# Patient Record
Sex: Female | Born: 1970 | Race: White | Hispanic: No | Marital: Married | Smoking: Never smoker
Health system: Southern US, Community
[De-identification: ages and names within clinical notes are randomized; demographics above are authoritative.]

## PROBLEM LIST (undated history)

## (undated) DIAGNOSIS — R51 Headache: Secondary | ICD-10-CM

## (undated) DIAGNOSIS — D649 Anemia, unspecified: Secondary | ICD-10-CM

## (undated) DIAGNOSIS — R0602 Shortness of breath: Secondary | ICD-10-CM

## (undated) DIAGNOSIS — M1990 Unspecified osteoarthritis, unspecified site: Secondary | ICD-10-CM

## (undated) DIAGNOSIS — F32A Depression, unspecified: Secondary | ICD-10-CM

## (undated) DIAGNOSIS — Q2112 Patent foramen ovale: Secondary | ICD-10-CM

## (undated) DIAGNOSIS — D682 Hereditary deficiency of other clotting factors: Secondary | ICD-10-CM

## (undated) DIAGNOSIS — Q211 Atrial septal defect: Secondary | ICD-10-CM

## (undated) DIAGNOSIS — D6851 Activated protein C resistance: Secondary | ICD-10-CM

## (undated) DIAGNOSIS — F329 Major depressive disorder, single episode, unspecified: Secondary | ICD-10-CM

## (undated) DIAGNOSIS — I2699 Other pulmonary embolism without acute cor pulmonale: Secondary | ICD-10-CM

## (undated) HISTORY — PX: CHOLECYSTECTOMY: SHX55

## (undated) HISTORY — PX: ABDOMINAL HYSTERECTOMY: SHX81

## (undated) HISTORY — PX: TONSILLECTOMY: SUR1361

## (undated) HISTORY — PX: ANKLE SURGERY: SHX546

## (undated) HISTORY — PX: PATENT FORAMEN OVALE(PFO) CLOSURE: CATH118300

## (undated) HISTORY — PX: OTHER SURGICAL HISTORY: SHX169

---

## 1999-11-08 ENCOUNTER — Ambulatory Visit (HOSPITAL_BASED_OUTPATIENT_CLINIC_OR_DEPARTMENT_OTHER): Admission: RE | Admit: 1999-11-08 | Discharge: 1999-11-09 | Payer: Self-pay | Admitting: *Deleted

## 1999-11-08 ENCOUNTER — Encounter (INDEPENDENT_AMBULATORY_CARE_PROVIDER_SITE_OTHER): Payer: Self-pay | Admitting: *Deleted

## 2009-03-17 ENCOUNTER — Encounter: Admission: RE | Admit: 2009-03-17 | Discharge: 2009-03-17 | Payer: Self-pay | Admitting: Neurosurgery

## 2009-03-30 ENCOUNTER — Encounter: Admission: RE | Admit: 2009-03-30 | Discharge: 2009-03-30 | Payer: Self-pay | Admitting: Neurosurgery

## 2010-06-02 NOTE — Op Note (Signed)
[facility]  Patient:    Colleen Olsen, Colleen Olsen                   MRN: [id number] Proc. Date: 11/08/99 Adm. Date:  [id number] Attending:  Aundria Mems                           Operative Report  PREOPERATIVE DIAGNOSIS:  Chronic hyperplastic adenotonsillitis.  POSTOPERATIVE DIAGNOSIS:  Chronic hyperplastic adenotonsillitis.  PROCEDURE:  Adenotonsillectomy.  DESCRIPTION OF PROCEDURE:  The patient had general orotracheal anesthesia. The Crowe-Davis mouth gag was inserted and the patient put in the [place] position.  Inspection of the oral cavity revealed tonsils were 4+ enlarged and kissing.  Able to get a glimpse with a mirror, and there was a moderate amount of adenoid tissue near the posterior choanae in the midline.  It was elected to remove the tonils initially because of their size and the accessibility to the nasopharynx.  The left tonsil was grasped at the superior pole and removed by electrical dissection, maintaining hemostasis with electrocautery.  The right tonsil was removed in a similar fashion.  A red rubber catheter was then placed through the left nasal chamber and used to elevate the soft palate. Under mirror visualization of the nasopharynx, the moderate-sized midline adenoid tissue near the posterior choanae was ablated by suction cauterization thoroughly.  Hemostasis remained complete.  Blood loss for the procedure is less than 10 cc.  The patient tolerated the procedure well, was taken to the recovery room in stable general condition. DD:  11/08/99 TD:  11/08/99 Job: [id number]

## 2012-09-05 ENCOUNTER — Inpatient Hospital Stay (HOSPITAL_COMMUNITY)
Admission: AD | Admit: 2012-09-05 | Discharge: 2012-09-09 | DRG: 195 | Disposition: A | Discharge status: Home / Self Care | Payer: 59 | Source: Other Acute Inpatient Hospital | Attending: Family Medicine | Admitting: Family Medicine

## 2012-09-05 ENCOUNTER — Encounter (HOSPITAL_COMMUNITY): Payer: Self-pay | Admitting: *Deleted

## 2012-09-05 DIAGNOSIS — Z9884 Bariatric surgery status: Secondary | ICD-10-CM

## 2012-09-05 DIAGNOSIS — Z77090 Contact with and (suspected) exposure to asbestos: Secondary | ICD-10-CM

## 2012-09-05 DIAGNOSIS — K5900 Constipation, unspecified: Secondary | ICD-10-CM | POA: Diagnosis not present

## 2012-09-05 DIAGNOSIS — J189 Pneumonia, unspecified organism: Principal | ICD-10-CM | POA: Diagnosis present

## 2012-09-05 DIAGNOSIS — B373 Candidiasis of vulva and vagina: Secondary | ICD-10-CM | POA: Diagnosis not present

## 2012-09-05 DIAGNOSIS — F329 Major depressive disorder, single episode, unspecified: Secondary | ICD-10-CM | POA: Diagnosis present

## 2012-09-05 DIAGNOSIS — M545 Low back pain, unspecified: Secondary | ICD-10-CM | POA: Diagnosis present

## 2012-09-05 DIAGNOSIS — B3731 Acute candidiasis of vulva and vagina: Secondary | ICD-10-CM | POA: Diagnosis not present

## 2012-09-05 DIAGNOSIS — E669 Obesity, unspecified: Secondary | ICD-10-CM | POA: Diagnosis present

## 2012-09-05 DIAGNOSIS — Z79899 Other long term (current) drug therapy: Secondary | ICD-10-CM

## 2012-09-05 DIAGNOSIS — Z6834 Body mass index (BMI) 34.0-34.9, adult: Secondary | ICD-10-CM

## 2012-09-05 DIAGNOSIS — G8929 Other chronic pain: Secondary | ICD-10-CM | POA: Diagnosis present

## 2012-09-05 DIAGNOSIS — F3289 Other specified depressive episodes: Secondary | ICD-10-CM | POA: Diagnosis present

## 2012-09-05 HISTORY — DX: Unspecified osteoarthritis, unspecified site: M19.90

## 2012-09-05 HISTORY — DX: Depression, unspecified: F32.A

## 2012-09-05 HISTORY — DX: Major depressive disorder, single episode, unspecified: F32.9

## 2012-09-05 HISTORY — DX: Headache: R51

## 2012-09-05 HISTORY — DX: Anemia, unspecified: D64.9

## 2012-09-05 HISTORY — DX: Shortness of breath: R06.02

## 2012-09-05 MED ORDER — MORPHINE SULFATE ER 15 MG PO TBCR
15.0000 mg | EXTENDED_RELEASE_TABLET | Freq: Three times a day (TID) | ORAL | Status: DC
  Administered 2012-09-05 – 2012-09-09 (×11): 15 mg via ORAL
  Filled 2012-09-05 (×11): qty 1

## 2012-09-05 MED ORDER — ACETAMINOPHEN 325 MG PO TABS
650.0000 mg | ORAL_TABLET | Freq: Four times a day (QID) | ORAL | Status: DC | PRN
  Administered 2012-09-08: 650 mg via ORAL
  Filled 2012-09-05: qty 2

## 2012-09-05 MED ORDER — ACETAMINOPHEN 650 MG RE SUPP: 650.0000 mg | Freq: Four times a day (QID) | RECTAL | Status: DC | PRN

## 2012-09-05 MED ORDER — DULOXETINE HCL 60 MG PO CPEP
60.0000 mg | ORAL_CAPSULE | Freq: Every day | ORAL | Status: DC
Start: 2012-09-06 — End: 2012-09-06

## 2012-09-05 MED ORDER — TOPIRAMATE 100 MG PO TABS: 100.0000 mg | ORAL_TABLET | Freq: Every day | ORAL | Status: DC

## 2012-09-05 MED ORDER — ONDANSETRON HCL 4 MG/2ML IJ SOLN: 4.0000 mg | Freq: Four times a day (QID) | INTRAMUSCULAR | Status: DC | PRN

## 2012-09-05 MED ORDER — ALBUTEROL SULFATE (5 MG/ML) 0.5% IN NEBU
2.5000 mg | INHALATION_SOLUTION | Freq: Four times a day (QID) | RESPIRATORY_TRACT | Status: DC
  Administered 2012-09-06: 2.5 mg via RESPIRATORY_TRACT
  Filled 2012-09-05: qty 0.5

## 2012-09-05 MED ORDER — ALUM & MAG HYDROXIDE-SIMETH 200-200-20 MG/5ML PO SUSP: 30.0000 mL | Freq: Four times a day (QID) | ORAL | Status: DC | PRN

## 2012-09-05 MED ORDER — ONDANSETRON HCL 4 MG PO TABS
4.0000 mg | ORAL_TABLET | Freq: Four times a day (QID) | ORAL | Status: DC | PRN
  Administered 2012-09-07: 4 mg via ORAL
  Filled 2012-09-05: qty 1

## 2012-09-05 MED ORDER — VANCOMYCIN HCL 10 G IV SOLR
2000.0000 mg | Freq: Once | INTRAVENOUS | Status: AC
  Administered 2012-09-06: 2000 mg via INTRAVENOUS
  Filled 2012-09-05: qty 2000

## 2012-09-05 MED ORDER — GABAPENTIN 600 MG PO TABS
600.0000 mg | ORAL_TABLET | Freq: Four times a day (QID) | ORAL | Status: DC
  Administered 2012-09-05 – 2012-09-09 (×14): 600 mg via ORAL
  Filled 2012-09-05 (×17): qty 1

## 2012-09-05 MED ORDER — ENOXAPARIN SODIUM 40 MG/0.4ML ~~LOC~~ SOLN
40.0000 mg | SUBCUTANEOUS | Status: DC
  Administered 2012-09-05 – 2012-09-08 (×4): 40 mg via SUBCUTANEOUS
  Filled 2012-09-05 (×5): qty 0.4

## 2012-09-05 MED ORDER — ALBUTEROL SULFATE (5 MG/ML) 0.5% IN NEBU: 2.5000 mg | INHALATION_SOLUTION | RESPIRATORY_TRACT | Status: DC | PRN

## 2012-09-05 MED ORDER — VANCOMYCIN HCL IN DEXTROSE 1-5 GM/200ML-% IV SOLN
1000.0000 mg | Freq: Two times a day (BID) | INTRAVENOUS | Status: DC
  Administered 2012-09-06 – 2012-09-08 (×4): 1000 mg via INTRAVENOUS
  Filled 2012-09-05 (×5): qty 200

## 2012-09-05 MED ORDER — HYDROMORPHONE HCL PF 1 MG/ML IJ SOLN: 0.5000 mg | INTRAMUSCULAR | Status: DC | PRN

## 2012-09-05 MED ORDER — MELOXICAM 15 MG PO TABS: 15.0000 mg | ORAL_TABLET | Freq: Every day | ORAL | Status: DC

## 2012-09-05 MED ORDER — SODIUM CHLORIDE 0.9 % IV SOLN
INTRAVENOUS | Status: DC
  Administered 2012-09-05 – 2012-09-07 (×2): via INTRAVENOUS

## 2012-09-05 MED ORDER — ZOLPIDEM TARTRATE 5 MG PO TABS
5.0000 mg | ORAL_TABLET | Freq: Every evening | ORAL | Status: DC | PRN
  Administered 2012-09-06 – 2012-09-08 (×3): 5 mg via ORAL
  Filled 2012-09-05 (×3): qty 1

## 2012-09-05 MED ORDER — OXYCODONE HCL 5 MG PO TABS: 5.0000 mg | ORAL_TABLET | ORAL | Status: DC | PRN

## 2012-09-05 MED ORDER — DEXTROSE 5 % IV SOLN
2.0000 g | Freq: Two times a day (BID) | INTRAVENOUS | Status: DC
  Administered 2012-09-06 – 2012-09-08 (×6): 2 g via INTRAVENOUS
  Filled 2012-09-05 (×7): qty 2

## 2012-09-05 MED ORDER — LEVOFLOXACIN IN D5W 750 MG/150ML IV SOLN
750.0000 mg | INTRAVENOUS | Status: DC
  Administered 2012-09-06 – 2012-09-07 (×3): 750 mg via INTRAVENOUS
  Filled 2012-09-05 (×4): qty 150

## 2012-09-05 NOTE — H&P (Signed)
[facility] History and Physical  STARSHA MORNING WNU:[id number] DOB: 11-06-1970 DOA: 09/05/2012  Referring physician:  Bobette Mo EDP PCP:  Mauricio Po  Specialists:   Chief Complaint: Fevers Chills Cough and SOB.  HPI: Colleen Olsen is a 42 y.o. female transferred from the [facility] ED to [facility] at family's request due to worsening pneumonia.  She was treated for pneumonia since 08/03 with outpatient antibiotics first Azithromycin, then 3 days of Levaquin, and then she had 1 day of clindamycin.   She returned to the ED at [facility] due to worsening SOB, fevers and chills and dry cough.   A ct scan was performed and revealed worsening LLL infiltrates.       Review of Systems: The patient denies anorexia, headaches, weight loss, vision loss, diplopia, dizziness, decreased hearing, rhinitis, hoarseness, chest pain, syncope, dyspnea on exertion, peripheral edema, balance deficits, hemoptysis, abdominal pain, nausea, vomiting, diarrhea, constipation, hematemesis, melena, hematochezia, severe indigestion/heartburn, dysuria, hematuria, incontinence, muscle weakness, suspicious skin lesions, transient blindness, difficulty walking, depression, unusual weight change, abnormal bleeding, enlarged lymph nodes, angioedema, and breast masses.    Past Medical History  Diagnosis Date  . Depression   . Shortness of breath   . Headache(784.0)   . Arthritis   . Anemia     Past Surgical History  Procedure Laterality Date  . Cholecystectomy    . Tonsillectomy    . Gastri bypass 2003      Prior to Admission medications   Medication Sig Start Date End Date Taking? Authorizing Provider  cyanocobalamin (,VITAMIN B-12,) 1000 MCG/ML injection Inject 1,000 mcg into the skin every 30 (thirty) days.   Yes Historical Provider, MD  DULoxetine (CYMBALTA) 60 MG capsule Take 60 mg by mouth daily.   Yes Historical Provider, MD  gabapentin (NEURONTIN) 600 MG tablet Take 600 mg  by mouth 4 (four) times daily.   Yes Historical Provider, MD  meloxicam (MOBIC) 15 MG tablet Take 15 mg by mouth daily.   Yes Historical Provider, MD  morphine (MSIR) 15 MG tablet Take 15 mg by mouth 3 (three) times daily.   Yes Historical Provider, MD  topiramate (TOPAMAX) 100 MG tablet Take 100 mg by mouth daily.   Yes Historical Provider, MD  zolpidem (AMBIEN) 10 MG tablet Take 10 mg by mouth at bedtime.   Yes Historical Provider, MD    Allergies  Allergen Reactions  . Doxycycline     confusion  . Latex     Hives, burning  . Paxil [Paroxetine Hcl]     Hives, itching  . Prozac [Fluoxetine Hcl]     Hives, itching  . Sulfa Antibiotics     Hives     Social History:  has no tobacco, alcohol, and drug history on file.     Family History  Problem Relation Age of Onset  . Bradycardia Father     Pacemaker  . Hypertension Father   . Hypertension Mother   . Lung cancer Mother     Deceased    (be sure to complete)   Physical Exam:  GEN:  Pleasant but Ill appearing Obese   42 y.o. Caucasian female  examined  and in no acute distress; cooperative with exam Filed Vitals:   09/05/12 2105  BP: 113/74  Pulse: 83  Temp: 98.5 F (36.9 C)  TempSrc: Oral  Resp: 18  Height: 5\' 8"  (1.727 m)  Weight: 102.6 kg (226 lb 3.1 oz)  SpO2: 100%   Blood pressure 113/74,  pulse 83, temperature 98.5 F (36.9 C), temperature source Oral, resp. rate 18, height 5\' 8"  (1.727 m), weight 102.6 kg (226 lb 3.1 oz), SpO2 100.00%. PSYCH: SHe is alert and oriented x4; does not appear anxious does not appear depressed; affect is normal HEENT: Normocephalic and Atraumatic, Mucous membranes pink; PERRLA; EOM intact; Fundi:  Benign;  No scleral icterus, Nares: Patent, Oropharynx: Clear, Fair Dentition, Neck:  FROM, no cervical lymphadenopathy nor thyromegaly or carotid bruit; no JVD; Breasts:: Not examined CHEST WALL: No tenderness CHEST: Normal respiration, clear to auscultation bilaterally HEART: Regular  rate and rhythm; no murmurs rubs or gallops BACK: No kyphosis or scoliosis; no CVA tenderness ABDOMEN: Positive Bowel Sounds, Obese, soft non-tender; no masses, no organomegaly. Rectal Exam: Not done EXTREMITIES: No cyanosis, clubbing or edema; no ulcerations. Genitalia: not examined PULSES: 2+ and symmetric SKIN: Normal hydration no rash or ulceration CNS: Cranial nerves 2-12 grossly intact no focal neurologic deficit    Labs on Admission:  Basic Metabolic Panel: No results found for this basename: NA, K, CL, CO2, GLUCOSE, BUN, CREATININE, CALCIUM, MG, PHOS,  in the last 168 hours Liver Function Tests: No results found for this basename: AST, ALT, ALKPHOS, BILITOT, PROT, ALBUMIN,  in the last 168 hours No results found for this basename: LIPASE, AMYLASE,  in the last 168 hours No results found for this basename: AMMONIA,  in the last 168 hours CBC: No results found for this basename: WBC, NEUTROABS, HGB, HCT, MCV, PLT,  in the last 168 hours Cardiac Enzymes: No results found for this basename: CKTOTAL, CKMB, CKMBINDEX, TROPONINI,  in the last 168 hours  BNP (last 3 results) No results found for this basename: PROBNP,  in the last 8760 hours CBG: No results found for this basename: GLUCAP,  in the last 168 hours  Radiological Exams on Admission: No results found.     Assessment/Plan Principal Problem:   HCAP (healthcare-associated pneumonia)   1.  HCAP- Blood cultures X 2 re-ordered, and Placed on IV Vancomycin, Cefepime and Levaquin, and Albuterol Nebs,  And O2 PRN.  Droplet Precautions.  ID Consult in AM.    2.  DVT prophylaxis with Lovenox.       Code Status:  FULL CODE  Family Communication:    Husband at Bedside Disposition Plan:     Return to Home on Discharge  Time spent: 33 Minutes  Ron Parker [facility] Pager [phone]  If 7PM-7AM, please contact night-coverage [email or web address] Password [facility] 09/05/2012, 10:15 PM

## 2012-09-05 NOTE — Progress Notes (Signed)
ANTIBIOTIC CONSULT NOTE - INITIAL  Pharmacy Consult for vancomycin Indication: pneumonia  Allergies  Allergen Reactions  . Doxycycline     confusion  . Latex     Hives, burning  . Paxil [Paroxetine Hcl]     Hives, itching  . Prozac [Fluoxetine Hcl]     Hives, itching  . Sulfa Antibiotics     Hives     Patient Measurements: Height: 5\' 8"  (172.7 cm) Weight: 226 lb 3.1 oz (102.6 kg) IBW/kg (Calculated) : 63.9   Vital Signs: Temp: 98.5 F (36.9 C) (08/22 2105) Temp src: Oral (08/22 2105) BP: 113/74 mmHg (08/22 2105) Pulse Rate: 83 (08/22 2105) Intake/Output from previous day:   Intake/Output from this shift:    Labs: No results found for this basename: WBC, HGB, PLT, LABCREA, CREATININE,  in the last 72 hours CrCl is unknown because no creatinine reading has been taken. No results found for this basename: VANCOTROUGH, VANCOPEAK, VANCORANDOM, GENTTROUGH, GENTPEAK, GENTRANDOM, TOBRATROUGH, TOBRAPEAK, TOBRARND, AMIKACINPEAK, AMIKACINTROU, AMIKACIN,  in the last 72 hours   Microbiology: No results found for this or any previous visit (from the past 720 hour(s)).  Medical History: Past Medical History  Diagnosis Date  . Depression   . Shortness of breath   . Headache(784.0)   . Arthritis   . Anemia     Medications:  See med hx  Assessment: Colleen Olsen is a 42 y.o. female transferred from the [facility] ED to [facility] at family's request due to worsening pneumonia. She was treated for pneumonia since 08/03 with outpatient antibiotics first Azithromycin, then 3 days of Levaquin, and then she had 1 day of clindamycin. She returned to the ED at [facility] due to worsening SOB, fevers and chills and dry cough. A ct scan was performed and revealed worsening LLL infiltrates.  Per pt she did not get any abx in the [facility] ED.  Bmet ordered for am labs, will assume normal renal function.  Wt = 102.6 kg.  Pharmacy has been asked to dose vancomycin after  blood cultures are drawn for HCAP as part of vanc/cefepime/levaquin therapy.  Blood cx have not yet been drawn.  An ID consult will be ordered in the AM.  Goal of Therapy:  Vancomycin trough level 15-20 mcg/ml  Plan:  1. Vancomycin 2000 mg loading dose after blood cultures have been drawn, then vancomycin 1000 mg IV q12h per obesity dosing nomogram 2. Cefepime 2 gm IV q12h per MD 3. Levaquin 750 mg IV q24 hr per MD 4. F/u am bmet to assess renal function 5. F/u fever curve, renal fxn, cultures, WBC, CXR, clinical course 6. F/u ID consult recs 7. Measure antibiotic drug levels at steady state Follow up culture results Herby Abraham, Pharm.D. [phone] 09/05/2012 11:05 PM

## 2012-09-06 ENCOUNTER — Encounter (HOSPITAL_COMMUNITY): Payer: Self-pay | Admitting: *Deleted

## 2012-09-06 LAB — STREP PNEUMONIAE URINARY ANTIGEN: Strep Pneumo Urinary Antigen: NEGATIVE

## 2012-09-06 LAB — BASIC METABOLIC PANEL
GFR calc Af Amer: >90 mL/min (ref >90)
GFR calc non Af Amer: 88 mL/min — ABNORMAL LOW (ref >90)
Potassium: 3.9 mEq/L (ref 3.5–5.1)
Sodium: 141 mEq/L (ref 135–145)

## 2012-09-06 LAB — CBC
MCH: 29.7 pg (ref 26.0–34.0)
MCHC: 32.4 g/dL (ref 30.0–36.0)
Platelets: 275 10*3/uL (ref 150–400)

## 2012-09-06 MED ORDER — NYSTATIN 100000 UNIT/ML MT SUSP
5.0000 mL | Freq: Four times a day (QID) | OROMUCOSAL | Status: DC
  Administered 2012-09-06 – 2012-09-09 (×10): 500000 [IU] via ORAL
  Filled 2012-09-06 (×14): qty 5

## 2012-09-06 MED ORDER — DULOXETINE HCL 60 MG PO CPEP
60.0000 mg | ORAL_CAPSULE | Freq: Every day | ORAL | Status: DC
  Administered 2012-09-06 – 2012-09-08 (×4): 60 mg via ORAL
  Filled 2012-09-06 (×5): qty 1

## 2012-09-06 MED ORDER — TOPIRAMATE 100 MG PO TABS
100.0000 mg | ORAL_TABLET | Freq: Every day | ORAL | Status: DC
  Administered 2012-09-06 – 2012-09-08 (×4): 100 mg via ORAL
  Filled 2012-09-06 (×5): qty 1

## 2012-09-06 MED ORDER — MELOXICAM 15 MG PO TABS
15.0000 mg | ORAL_TABLET | Freq: Every day | ORAL | Status: DC
  Administered 2012-09-06 – 2012-09-08 (×4): 15 mg via ORAL
  Filled 2012-09-06 (×5): qty 1

## 2012-09-06 MED ORDER — ALBUTEROL SULFATE (5 MG/ML) 0.5% IN NEBU: 2.5000 mg | INHALATION_SOLUTION | RESPIRATORY_TRACT | Status: DC | PRN

## 2012-09-06 NOTE — Progress Notes (Signed)
RT completed patient assessment.  Patient transferred from another facility for PNA.  No chest xray on file but H&P note stated that CT at [place] showed worsening PNA.  Patient has no respiratory history and has never smoked.  Pt has never taken treatments before.  RT is going to start patient on a flutter device. BBS are diminished.  Sats 94% on RA.    Changed treatments to Q4prn.

## 2012-09-06 NOTE — Progress Notes (Signed)
Colleen Olsen GNF:[id number] DOB: 1970-08-28 DOA: 09/05/2012 PCP: No primary provider on file.  Brief narrative: 42 yr old CF admitted from [facility] Ed 8/22 presenting with worsening symptoms of shortness of breath. She states she initially started feeling poorly 08/17/2012 and presented to the emergency room where she was given a course of azithromycin which she obtained on 08/20/2012 and completed on 08/28/2012. She still had some mild malaise and abdominal pain and represented to [facility] ED and was sent home. On 09/04/2012, she went back back to the emergency room with temperature of 104, and a CT scan was performed at the time showing pneumonia which was persistent.  She was prescribed Levaquin for this and in the interim 18 821 09/05/2012 clindamycin was added on because of results of a blood culture Her primary care physician Dr. Mauricio Po performed another CT scan which confirmed on 09/05/2012 D pneumonia with findings and changes suggestive of either MAC, Legionella, atypical allergic phenomenon  Past medical history-As per Problem list Chart reviewed as below- None  Consultants:  None as yet  Procedures:  None performed here, CT scan as above  Antibiotics:  Cefepime 8/22  Levofloxacin 8/22  Vancomycin 8/22   Subjective  Feel that she is doing a little better. Still has some right upper quadrant pain. Denies nausea vomiting chest pain Had some heaviness in her chest and mild shortness of rest overnight but this is resolving. Tolerating diet. Husband at bedside   Objective    Interim History: None  Telemetry: Non tele  Objective: Filed Vitals:   09/05/12 2105 09/06/12 0246 09/06/12 0615  BP: 113/74  105/71  Pulse: 83  87  Temp: 98.5 F (36.9 C)  98.5 F (36.9 C)  TempSrc: Oral  Oral  Resp: 18  20  Height: 5\' 8"  (1.727 m)    Weight: 102.6 kg (226 lb 3.1 oz)    SpO2: 100% 94% 95%    Intake/Output Summary (Last 24 hours) at 09/06/12  0830 Last data filed at 09/06/12 0600  Gross per 24 hour  Intake   1840 ml  Output      0 ml  Net   1840 ml    Exam:  General: EOMI, flat affect.  Cardiovascular: s1 s2 no m/r/g Respiratory: clear, no TVR, TVF Abdomen: soft, NT/ND Skin no LE edema Neuro-ROM intact  Data Reviewed: Basic Metabolic Panel:  Recent Labs Lab 09/06/12 0620  NA 141  K 3.9  CL 110  CO2 24  GLUCOSE 90  BUN 10  CREATININE 0.82  CALCIUM 8.5   Liver Function Tests: No results found for this basename: AST, ALT, ALKPHOS, BILITOT, PROT, ALBUMIN,  in the last 168 hours No results found for this basename: LIPASE, AMYLASE,  in the last 168 hours No results found for this basename: AMMONIA,  in the last 168 hours CBC:  Recent Labs Lab 09/06/12 0620  WBC 6.7  HGB 9.4*  HCT 29.0*  MCV 91.5  PLT 275   Cardiac Enzymes: No results found for this basename: CKTOTAL, CKMB, CKMBINDEX, TROPONINI,  in the last 168 hours BNP: No components found with this basename: POCBNP,  CBG: No results found for this basename: GLUCAP,  in the last 168 hours  No results found for this or any previous visit (from the past 240 hour(s)).   Studies:              All Imaging reviewed and is as per above notation   Scheduled Meds: . ceFEPime (  MAXIPIME) IV  2 g Intravenous Q12H  . DULoxetine  60 mg Oral QHS  . enoxaparin (LOVENOX) injection  40 mg Subcutaneous Q24H  . gabapentin  600 mg Oral QID  . levofloxacin (LEVAQUIN) IV  750 mg Intravenous Q24H  . meloxicam  15 mg Oral QHS  . morphine  15 mg Oral Q8H  . topiramate  100 mg Oral QHS  . vancomycin  1,000 mg Intravenous Q12H   Continuous Infusions: . sodium chloride 100 mL/hr at 09/05/12 2300     Assessment/Plan:  1. Failed outpatient Rx for Pnuemonia-Started broad spectrum HCAP coverage c Vanc, Cefepime, Levaquin. Continue Axid 100 cc per hour She has no White count or fever currently-Blood cultures from [facility] showed- 2 set showed 8/20-1+ for  Coagulase Neg staph, the other is neg so far.  Rpt CBC + Diff am-will get legionella, Strep Urinary antigen and HIV as not performed on admission-If patient doesn;t clinically improve, would get Pulm involved/ID, to review further options. 2. Reactive airway process-continue albuterol nebulization every 4 when necessary, flutter valve 3. H/o Exposure to asbestos-Works at [employer] in [place] and has been working at that building for 3 mo unlikely Pneumoconiosis 4. Depression-continue Cymbalta 60 mg daily 5. Chronic low back pain continue gabapentin 600 4 times a day, Topamax 100 daily  Code Status: Full Family Communication: Long and extensive discussion with family at bedside understand rationale for care and understand that we will consult if necessary further specialists Disposition Plan: Inpatient   Pleas Koch, MD  [facility] Pager [phone] 09/06/2012, 8:30 AM    LOS: 1 day

## 2012-09-07 LAB — CBC WITH DIFFERENTIAL/PLATELET
Basophils Relative: 1 % (ref 0–1)
Eosinophils Absolute: 0.9 10*3/uL — ABNORMAL HIGH (ref 0.0–0.7)
HCT: 29.8 % — ABNORMAL LOW (ref 36.0–46.0)
Hemoglobin: 9.3 g/dL — ABNORMAL LOW (ref 12.0–15.0)
MCH: 28.7 pg (ref 26.0–34.0)
MCHC: 31.2 g/dL (ref 30.0–36.0)
Monocytes Absolute: 0.6 10*3/uL (ref 0.1–1.0)
Monocytes Relative: 9 % (ref 3–12)
Neutro Abs: 3 10*3/uL (ref 1.7–7.7)

## 2012-09-07 LAB — LEGIONELLA ANTIGEN, URINE: Legionella Antigen, Urine: NEGATIVE

## 2012-09-07 MED ORDER — FLUCONAZOLE 150 MG PO TABS
150.0000 mg | ORAL_TABLET | Freq: Once | ORAL | Status: AC
  Administered 2012-09-07: 150 mg via ORAL
  Filled 2012-09-07: qty 1

## 2012-09-07 MED ORDER — SODIUM CHLORIDE 0.9 % IJ SOLN
3.0000 mL | Freq: Two times a day (BID) | INTRAMUSCULAR | Status: DC
  Administered 2012-09-07 – 2012-09-09 (×4): 3 mL via INTRAVENOUS

## 2012-09-07 MED ORDER — SENNOSIDES-DOCUSATE SODIUM 8.6-50 MG PO TABS
1.0000 | ORAL_TABLET | Freq: Two times a day (BID) | ORAL | Status: DC
  Administered 2012-09-07 – 2012-09-09 (×5): 1 via ORAL
  Filled 2012-09-07 (×5): qty 1

## 2012-09-07 NOTE — Progress Notes (Signed)
Colleen Olsen:[id number] DOB: February 17, 1970 DOA: 09/05/2012 PCP: No primary provider on file.  Brief narrative: 42 yr old CF admitted from [facility] Ed 8/22 presenting with worsening symptoms of shortness of breath. She states she initially started feeling poorly 08/17/2012 and presented to the emergency room where she was given a course of azithromycin which she obtained on 08/20/2012 and completed on 08/28/2012. She still had some mild malaise and abdominal pain and represented to [facility] ED and was sent home. On 09/04/2012, she went back back to the emergency room with temperature of 104, and a CT scan was performed at the time showing pneumonia which was persistent.  She was prescribed Levaquin for this and in the interim 18 821 09/05/2012 clindamycin was added on because of results of a blood culture Her primary care physician Dr. Mauricio Po performed another CT scan which confirmed on 09/05/2012  pneumonia with findings and changes suggestive of either MAC, Legionella, atypical allergic phenomenon  Past medical history-As per Problem list Chart reviewed as below- None  Consultants:  None as yet  Procedures:  None performed here, CT scan as above  Antibiotics:  Cefepime 8/22  Levofloxacin 8/22  Vancomycin 8/22   Subjective  Feel that she is doing much better. Felt like she had mouth pain and usually gets thrush Also has vaginal discharge No shortness breath no nausea no vomiting no blurred or double vision   Objective    Interim History: None  Telemetry: Non tele  Objective: Filed Vitals:   09/06/12 1909 09/06/12 2309 09/07/12 0332 09/07/12 0624  BP: 101/69 102/68 103/70 90/57  Pulse: 82 85 79 78  Temp: 98.2 F (36.8 C) 98.3 F (36.8 C) 98 F (36.7 C) 98.2 F (36.8 C)  TempSrc: Oral Oral Oral Oral  Resp: 20 18 18 20   Height:      Weight:      SpO2: 94% 95% 96% 96%    Intake/Output Summary (Last 24 hours) at 09/07/12 1433 Last data filed  at 09/07/12 0232  Gross per 24 hour  Intake 2643.33 ml  Output    800 ml  Net 1843.33 ml    Exam:  General: EOMI, flat affect.  Cardiovascular: s1 s2 no m/r/g Respiratory: clear, no TVR, TVF Abdomen: soft, NT/ND Skin no LE edema Neuro-ROM intact  Data Reviewed: Basic Metabolic Panel:  Recent Labs Lab 09/06/12 0620  NA 141  K 3.9  CL 110  CO2 24  GLUCOSE 90  BUN 10  CREATININE 0.82  CALCIUM 8.5   Liver Function Tests: No results found for this basename: AST, ALT, ALKPHOS, BILITOT, PROT, ALBUMIN,  in the last 168 hours No results found for this basename: LIPASE, AMYLASE,  in the last 168 hours No results found for this basename: AMMONIA,  in the last 168 hours CBC:  Recent Labs Lab 09/06/12 0620 09/07/12 0521  WBC 6.7 6.6  NEUTROABS  --  3.0  HGB 9.4* 9.3*  HCT 29.0* 29.8*  MCV 91.5 92.0  PLT 275 277   Cardiac Enzymes: No results found for this basename: CKTOTAL, CKMB, CKMBINDEX, TROPONINI,  in the last 168 hours BNP: No components found with this basename: POCBNP,  CBG: No results found for this basename: GLUCAP,  in the last 168 hours  Recent Results (from the past 240 hour(s))  CULTURE, BLOOD (ROUTINE X 2)     Status: None   Collection Time    09/05/12 11:00 PM      Result Value Range Status  Specimen Description BLOOD RIGHT ARM   Final   Special Requests BOTTLES DRAWN AEROBIC AND ANAEROBIC 5CC   Final   Culture  Setup Time     Final   Value: 09/06/2012 05:34     Performed at [facility]   Culture     Final   Value:        BLOOD CULTURE RECEIVED NO GROWTH TO DATE CULTURE WILL BE HELD FOR 5 DAYS BEFORE ISSUING A FINAL NEGATIVE REPORT     Performed at [facility]   Report Status PENDING   Incomplete  CULTURE, BLOOD (ROUTINE X 2)     Status: None   Collection Time    09/05/12 11:25 PM      Result Value Range Status   Specimen Description BLOOD LEFT ARM   Final   Special Requests BOTTLES DRAWN AEROBIC AND ANAEROBIC 5CC   Final     Culture  Setup Time     Final   Value: 09/06/2012 05:33     Performed at [facility]   Culture     Final   Value:        BLOOD CULTURE RECEIVED NO GROWTH TO DATE CULTURE WILL BE HELD FOR 5 DAYS BEFORE ISSUING A FINAL NEGATIVE REPORT     Performed at [facility]   Report Status PENDING   Incomplete     Studies:              All Imaging reviewed and is as per above notation   Scheduled Meds: . ceFEPime (MAXIPIME) IV  2 g Intravenous Q12H  . DULoxetine  60 mg Oral QHS  . enoxaparin (LOVENOX) injection  40 mg Subcutaneous Q24H  . gabapentin  600 mg Oral QID  . levofloxacin (LEVAQUIN) IV  750 mg Intravenous Q24H  . meloxicam  15 mg Oral QHS  . morphine  15 mg Oral Q8H  . nystatin  5 mL Oral QID  . senna-docusate  1 tablet Oral BID  . topiramate  100 mg Oral QHS  . vancomycin  1,000 mg Intravenous Q12H   Continuous Infusions:     Assessment/Plan:  1. Failed outpatient Rx for Pnuemonia-Started broad spectrum HCAP coverage c Vanc, Cefepime, Levaquin. IV fluids 100 cc per [facility] 8/24 She has no White count or fever currently-Blood cultures from [facility] showed- 2 set showed 8/20-1+ for Coagulase Neg staph, the other is neg so far.  White count stable and not elevated, Legionella negative HIV negative strep pneumo negative-consider narrowing at 6 to Levaquin monotherapy 8:25 AM 2. Reactive airway process-continue albuterol nebulization every 4 when necessary, flutter valve 3. H/o Exposure to asbestos-Works at [employer] in [place] and has been working at that building for 3 mo unlikely Pneumoconiosis 4. Candidiasis-to nystatin oral, fluconazole 150 4 vaginal candidiasis 5. Constipation-bowel regimen Senokot-S twice a day, Maalox 306 when necessary 6. Depression-continue Cymbalta 60 mg daily 7. Chronic low back pain continue gabapentin 600 4 times a day, Topamax 100 daily  Code Status: Full Family Communication: updated patient Disposition Plan:  Inpatient   Pleas Koch, MD  [facility] Pager [phone] 09/07/2012, 2:33 PM    LOS: 2 days

## 2012-09-08 MED ORDER — LEVOFLOXACIN 750 MG PO TABS
750.0000 mg | ORAL_TABLET | Freq: Every day | ORAL | Status: DC
  Administered 2012-09-08: 750 mg via ORAL
  Filled 2012-09-08 (×2): qty 1

## 2012-09-08 NOTE — Progress Notes (Signed)
Colleen Olsen:[id number] DOB: [birth year] DOA: 09/05/2012 PCP: No primary provider on file.  Brief narrative: 42 yr old CF admitted from [facility] Ed 8/22 presenting with worsening symptoms of shortness of breath. She states she initially started feeling poorly 08/17/2012 and presented to the emergency room where she was given a course of azithromycin which she obtained on 08/20/2012 and completed on 08/28/2012. She still had some mild malaise and abdominal pain and represented to [facility] ED and was sent home. On 09/04/2012, she went back back to the emergency room with temperature of 104, and a CT scan was performed at the time showing pneumonia which was persistent.  She was prescribed Levaquin for this and in the interim 18 821 09/05/2012 clindamycin was added on because of results of a blood culture Her primary care physician Dr. Mauricio Po performed another CT scan which confirmed on 09/05/2012 pneumonia with findings and changes suggestive of either MAC, Legionella, atypical allergic phenomenon  Past medical history-As per Problem list Chart reviewed as below- None  Consultants:  None as yet  Procedures:  None performed here, CT scan as above  Antibiotics:  Cefepime 8/22  Levofloxacin 8/22  Vancomycin 8/22   Subjective  Feel that she is doing much better. No stool yet.  Feels about 60% her usual self   Objective    Interim History: None  Telemetry: Non tele  Objective: Filed Vitals:   09/07/12 2157 09/08/12 0146 09/08/12 0607 09/08/12 1352  BP: 130/82 118/85 122/82 114/77  Pulse: 83 76 78 90  Temp: 97.8 F (36.6 C) 98 F (36.7 C) 98.2 F (36.8 C) 97.8 F (36.6 C)  TempSrc: Oral Oral Oral Oral  Resp: 18 16 17 18   Height:      Weight:      SpO2: 100% 98% 95% 100%    Intake/Output Summary (Last 24 hours) at 09/08/12 1513 Last data filed at 09/08/12 0017  Gross per 24 hour  Intake    403 ml  Output      0 ml  Net    403 ml     Exam:  General: EOMI, flat affect.  Cardiovascular: s1 s2 no m/r/g Respiratory: clear, no TVR, TVF Abdomen: soft, NT/ND Skin no LE edema Neuro-ROM intact  Data Reviewed: Basic Metabolic Panel:  Recent Labs Lab 09/06/12 0620  NA 141  K 3.9  CL 110  CO2 24  GLUCOSE 90  BUN 10  CREATININE 0.82  CALCIUM 8.5   Liver Function Tests: No results found for this basename: AST, ALT, ALKPHOS, BILITOT, PROT, ALBUMIN,  in the last 168 hours No results found for this basename: LIPASE, AMYLASE,  in the last 168 hours No results found for this basename: AMMONIA,  in the last 168 hours CBC:  Recent Labs Lab 09/06/12 0620 09/07/12 0521  WBC 6.7 6.6  NEUTROABS  --  3.0  HGB 9.4* 9.3*  HCT 29.0* 29.8*  MCV 91.5 92.0  PLT 275 277   Cardiac Enzymes: No results found for this basename: CKTOTAL, CKMB, CKMBINDEX, TROPONINI,  in the last 168 hours BNP: No components found with this basename: POCBNP,  CBG: No results found for this basename: GLUCAP,  in the last 168 hours  Recent Results (from the past 240 hour(s))  CULTURE, BLOOD (ROUTINE X 2)     Status: None   Collection Time    09/05/12 11:00 PM      Result Value Range Status   Specimen Description BLOOD RIGHT ARM  Final   Special Requests BOTTLES DRAWN AEROBIC AND ANAEROBIC 5CC   Final   Culture  Setup Time     Final   Value: 09/06/2012 05:34     Performed at [facility]   Culture     Final   Value:        BLOOD CULTURE RECEIVED NO GROWTH TO DATE CULTURE WILL BE HELD FOR 5 DAYS BEFORE ISSUING A FINAL NEGATIVE REPORT     Performed at [facility]   Report Status PENDING   Incomplete  CULTURE, BLOOD (ROUTINE X 2)     Status: None   Collection Time    09/05/12 11:25 PM      Result Value Range Status   Specimen Description BLOOD LEFT ARM   Final   Special Requests BOTTLES DRAWN AEROBIC AND ANAEROBIC 5CC   Final   Culture  Setup Time     Final   Value: 09/06/2012 05:33     Performed at [facility]   Culture     Final   Value:        BLOOD CULTURE RECEIVED NO GROWTH TO DATE CULTURE WILL BE HELD FOR 5 DAYS BEFORE ISSUING A FINAL NEGATIVE REPORT     Performed at [facility]   Report Status PENDING   Incomplete     Studies:              All Imaging reviewed and is as per above notation   Scheduled Meds: . DULoxetine  60 mg Oral QHS  . enoxaparin (LOVENOX) injection  40 mg Subcutaneous Q24H  . gabapentin  600 mg Oral QID  . levofloxacin  750 mg Oral Daily  . meloxicam  15 mg Oral QHS  . morphine  15 mg Oral Q8H  . nystatin  5 mL Oral QID  . senna-docusate  1 tablet Oral BID  . sodium chloride  3 mL Intravenous Q12H  . topiramate  100 mg Oral QHS   Continuous Infusions:     Assessment/Plan:  1. Failed outpatient Rx for Pnuemonia-Started broad spectrum HCAP coverage c Vanc, Cefepime, Levaquin-Transitioned 8/25 to PO levaquin.  IV fluids 100 cc per hour DC 8/24.  She has no White count or fever currently-Blood cultures from [facility] showed- 2 set showed 8/20-1+ for Coagulase Neg staph, the other is neg so far.  White count stable and not elevated, Legionella negative HIV negative strep pneumo negative. 2. Reactive airway process-continue albuterol nebulization every 4 when necessary, flutter valve 3. H/o Exposure to asbestos-Works at [employer] in [place] and has been working at that building for 3 mo ? unlikely Pneumoconiosis. 4. Candidiasis-to nystatin oral, fluconazole 150 4 vaginal candidiasis 5. Constipation-bowel regimen Senokot-S twice a day, Maalox 306 when necessary 6. Depression-continue Cymbalta 60 mg daily 7. Chronic low back pain continue gabapentin 600 x 4 times a day, Topamax 100 daily  Code Status: Full Family Communication: updated patient Disposition Plan: Inpatient-likely d/c am if feels better   Pleas Koch, MD  [facility] Pager [phone] 09/08/2012, 3:13 PM    LOS: 3 days

## 2012-09-09 ENCOUNTER — Encounter: Payer: Self-pay | Admitting: Family Medicine

## 2012-09-09 DIAGNOSIS — J189 Pneumonia, unspecified organism: Secondary | ICD-10-CM

## 2012-09-09 LAB — CBC WITH DIFFERENTIAL/PLATELET
Basophils Relative: 1 % (ref 0–1)
Eosinophils Absolute: 0.7 10*3/uL (ref 0.0–0.7)
Eosinophils Relative: 10 % — ABNORMAL HIGH (ref 0–5)
Hemoglobin: 10.7 g/dL — ABNORMAL LOW (ref 12.0–15.0)
MCH: 28.8 pg (ref 26.0–34.0)
MCHC: 31.9 g/dL (ref 30.0–36.0)
MCV: 90.3 fL (ref 78.0–100.0)
Monocytes Absolute: 0.7 10*3/uL (ref 0.1–1.0)
Monocytes Relative: 11 % (ref 3–12)
Neutrophils Relative %: 51 % (ref 43–77)

## 2012-09-09 MED ORDER — NYSTATIN 100000 UNIT/ML MT SUSP: 5.0000 mL | Freq: Four times a day (QID) | OROMUCOSAL | Status: DC

## 2012-09-09 MED ORDER — LEVOFLOXACIN 750 MG PO TABS: 750.0000 mg | ORAL_TABLET | Freq: Every day | ORAL | Status: DC

## 2012-09-09 NOTE — Discharge Summary (Signed)
Physician Discharge Summary  Colleen Olsen:[id number] DOB: 07-26-1970 DOA: 09/05/2012  PCP: Dema Severin, NP Admit date: 09/05/2012 Discharge date: 09/09/2012  Time spent: 40 minutes  Recommendations for Outpatient Follow-up:  1. Recommend OP rpt CXr in 2 weeks to ensure clearacne of  PNA 2. Recommend contnuing Nystatin until Abx complete 3. Stop date levaquin=09/15/12  Discharge Diagnoses:  Principal Problem:   HCAP (healthcare-associated pneumonia)   Discharge Condition: good  Diet recommendation:  regular  Filed Weights   09/05/12 2105  Weight: 102.6 kg (226 lb 3.1 oz)    History of present illness:  42 yr old CF admitted from [facility] Ed 8/22 presenting with worsening symptoms of shortness of breath. She states she initially started feeling poorly 08/17/2012 and presented to the emergency room where she was given a course of azithromycin which she obtained on 08/20/2012 and completed on 08/28/2012. She still had some mild malaise and abdominal pain and represented to [facility] ED and was sent home. On 09/04/2012, she went back back to the emergency room with temperature of 104, and a CT scan was performed at the time showing pneumonia which was persistent. She was prescribed Levaquin for this and in the interim 18 821 09/05/2012 clindamycin was added on because of results of a blood culture  Her primary care NP Colleen Olsen performed another CT scan which confirmed on 09/05/2012 pneumonia with findings and changes suggestive of either MAC, Legionella, atypical allergic phenomenon   Hospital Course:   1. Failed outpatient Rx for Pnuemonia-Started broad spectrum HCAP coverage c Vanc, Cefepime, Levaquin nintially. IV fluids 100 cc per hour DC 8/24. She has no White count or fever currently-Blood cultures from [facility] showed- 2 set showed 8/20-1+ for Coagulase Neg staph, the other is neg so far. White count stable and not elevated, Legionella negative HIV  negative strep pneumo negative.  Transitioned 8/25 to Olsen levaquin until 09/15/12 stop date.  Recommend CXR in 2 weeks to confirm clearance. 2. Reactive airway process-continue albuterol nebulization every 4 when necessary, flutter valve 3. H/o Exposure to asbestos-Works at [employer] in [place] and has been working at that building for 3 mo ? unlikely Pneumoconiosis. 4. Candidiasis-to nystatin oral, fluconazole 150 given x 1 for vaginal candidiasis-she should continue this Rx for as long as she is on Olsen antibiotics 5. Constipation-bowel regimen Senokot-S twice a day, Maalox 306 when necessary 6. Depression-continue Cymbalta 60 mg daily 7. Chronic low back pain continue gabapentin 600 x 4 times a day, Topamax 100 daily   Consultants:  None as yet Procedures:  None performed here, CT scan as above Antibiotics:  Cefepime 8/22-->8/25 Levofloxacin 8/22-->09/15/12 Vancomycin 8/22--8/25   Discharge Exam: Filed Vitals:   09/09/12 0440  BP: 116/77  Pulse: 80  Temp: 97.7 F (36.5 C)  Resp: 16   Alert, Pleasant.  Feels about 65% of hernormal self, no further fevers cough or cold  General: EOMI, NCAT Cardiovascular:  s1 s2 no m/r/g Respiratory: clear, no added sound  Discharge Instructions  Discharge Orders   Future Orders Complete By Expires   Diet - low sodium heart healthy  As directed    Discharge instructions  As directed    Comments:     You were cared for by a hospitalist during your hospital stay. If you have any questions about your discharge medications or the care you received while you were in the hospital after you are discharged, you can call the unit and asked to speak with the hospitalist  on call if the hospitalist that took care of you is not available. Once you are discharged, your primary care physician will handle any further medical issues. Please note that NO REFILLS for any discharge medications will be authorized once you are discharged, as it is imperative  that you return to your primary care physician (or establish a relationship with a primary care physician if you do not have one) for your aftercare needs so that they can reassess your need for medications and monitor your lab values. If you do not have a primary care physician, you can call [phone] for a physician referral.   Increase activity slowly  As directed        Medication List    STOP taking these medications       zolpidem 10 MG tablet  Commonly known as:  AMBIEN      TAKE these medications       cyanocobalamin 1000 MCG/ML injection  Commonly known as:  (VITAMIN B-12)  Inject 1,000 mcg into the skin every 30 (thirty) days.     DULoxetine 60 MG capsule  Commonly known as:  CYMBALTA  Take 60 mg by mouth at bedtime.     gabapentin 600 MG tablet  Commonly known as:  NEURONTIN  Take 600 mg by mouth 4 (four) times daily.     levofloxacin 750 MG tablet  Commonly known as:  LEVAQUIN  Take 1 tablet (750 mg total) by mouth daily.     meloxicam 15 MG tablet  Commonly known as:  MOBIC  Take 15 mg by mouth at bedtime.     morphine 15 MG tablet  Commonly known as:  MSIR  Take 15 mg by mouth 3 (three) times daily.     nystatin 100000 UNIT/ML suspension  Commonly known as:  MYCOSTATIN  Take 5 mLs (500,000 Units total) by mouth 4 (four) times daily.     topiramate 100 MG tablet  Commonly known as:  TOPAMAX  Take 100 mg by mouth at bedtime.       Allergies  Allergen Reactions  . Doxycycline     confusion  . Latex     Hives, burning  . Paxil [Paroxetine Hcl]     Hives, itching  . Prozac [Fluoxetine Hcl]     Hives, itching  . Sulfa Antibiotics     Hives       The results of significant diagnostics from this hospitalization (including imaging, microbiology, ancillary and laboratory) are listed below for reference.    Significant Diagnostic Studies: No results found.  Microbiology: Recent Results (from the past 240 hour(s))  CULTURE, BLOOD (ROUTINE X  2)     Status: None   Collection Time    09/05/12 11:00 PM      Result Value Range Status   Specimen Description BLOOD RIGHT ARM   Final   Special Requests BOTTLES DRAWN AEROBIC AND ANAEROBIC 5CC   Final   Culture  Setup Time     Final   Value: 09/06/2012 05:34     Performed at [facility]   Culture     Final   Value:        BLOOD CULTURE RECEIVED NO GROWTH TO DATE CULTURE WILL BE HELD FOR 5 DAYS BEFORE ISSUING A FINAL NEGATIVE REPORT     Performed at [facility]   Report Status PENDING   Incomplete  CULTURE, BLOOD (ROUTINE X 2)     Status: None   Collection Time  09/05/12 11:25 PM      Result Value Range Status   Specimen Description BLOOD LEFT ARM   Final   Special Requests BOTTLES DRAWN AEROBIC AND ANAEROBIC 5CC   Final   Culture  Setup Time     Final   Value: 09/06/2012 05:33     Performed at [facility]   Culture     Final   Value:        BLOOD CULTURE RECEIVED NO GROWTH TO DATE CULTURE WILL BE HELD FOR 5 DAYS BEFORE ISSUING A FINAL NEGATIVE REPORT     Performed at [facility]   Report Status PENDING   Incomplete     Labs: Basic Metabolic Panel:  Recent Labs Lab 09/06/12 0620  NA 141  K 3.9  CL 110  CO2 24  GLUCOSE 90  BUN 10  CREATININE 0.82  CALCIUM 8.5   Liver Function Tests: No results found for this basename: AST, ALT, ALKPHOS, BILITOT, PROT, ALBUMIN,  in the last 168 hours No results found for this basename: LIPASE, AMYLASE,  in the last 168 hours No results found for this basename: AMMONIA,  in the last 168 hours CBC:  Recent Labs Lab 09/06/12 0620 09/07/12 0521 09/09/12 0545  WBC 6.7 6.6 6.8  NEUTROABS  --  3.0 3.5  HGB 9.4* 9.3* 10.7*  HCT 29.0* 29.8* 33.5*  MCV 91.5 92.0 90.3  PLT 275 277 287   Cardiac Enzymes: No results found for this basename: CKTOTAL, CKMB, CKMBINDEX, TROPONINI,  in the last 168 hours BNP: BNP (last 3 results) No results found for this basename: PROBNP,  in the last 8760  hours CBG: No results found for this basename: GLUCAP,  in the last 168 hours     Signed:  Rhetta Mura  [facility] 09/09/2012, 8:26 AM

## 2012-09-09 NOTE — Care Management Note (Signed)
    Page 1 of 1   09/09/2012     12:09:09 PM   CARE MANAGEMENT NOTE 09/09/2012  Patient:  Colleen Olsen, Colleen Olsen   Account Number:  [id number]  Date Initiated:  09/09/2012  Documentation initiated by:  Letha Cape  Subjective/Objective Assessment:   dx pna  admit- lives with spouse.  pta indep.     Action/Plan:   Anticipated DC Date:  09/09/2012   Anticipated DC Plan:  HOME/SELF CARE      DC Planning Services  CM consult      Choice offered to / List presented to:             Status of service:  Completed, signed off Medicare Important Message given?   (If response is "NO", the following Medicare IM given date fields will be blank) Date Medicare IM given:   Date Additional Medicare IM given:    Discharge Disposition:  HOME/SELF CARE  Per UR Regulation:  Reviewed for med. necessity/level of care/duration of stay  If discussed at Long Length of Stay Meetings, dates discussed:    Comments:  09/09/12 12:08 Letha Cape RN, BSN [phone] patient lives with spouse, pta indep.  Patient has medication coverage and transportation at dc, no needs anticipated.

## 2012-09-12 LAB — CULTURE, BLOOD (ROUTINE X 2)

## 2017-03-07 DIAGNOSIS — I2699 Other pulmonary embolism without acute cor pulmonale: Secondary | ICD-10-CM

## 2017-03-07 DIAGNOSIS — F329 Major depressive disorder, single episode, unspecified: Secondary | ICD-10-CM

## 2017-03-08 DIAGNOSIS — R079 Chest pain, unspecified: Secondary | ICD-10-CM

## 2017-03-15 DIAGNOSIS — I2699 Other pulmonary embolism without acute cor pulmonale: Secondary | ICD-10-CM

## 2017-03-15 DIAGNOSIS — D6852 Prothrombin gene mutation: Secondary | ICD-10-CM

## 2017-03-15 DIAGNOSIS — D6851 Activated protein C resistance: Secondary | ICD-10-CM

## 2017-03-15 DIAGNOSIS — Z8673 Personal history of transient ischemic attack (TIA), and cerebral infarction without residual deficits: Secondary | ICD-10-CM

## 2017-04-12 DIAGNOSIS — D6852 Prothrombin gene mutation: Secondary | ICD-10-CM

## 2017-04-12 DIAGNOSIS — D6851 Activated protein C resistance: Secondary | ICD-10-CM

## 2017-04-12 DIAGNOSIS — I2699 Other pulmonary embolism without acute cor pulmonale: Secondary | ICD-10-CM

## 2018-01-17 ENCOUNTER — Inpatient Hospital Stay (HOSPITAL_COMMUNITY): Payer: Medicaid Other

## 2018-01-17 ENCOUNTER — Encounter (HOSPITAL_COMMUNITY): Payer: Self-pay | Admitting: Emergency Medicine

## 2018-01-17 ENCOUNTER — Other Ambulatory Visit: Payer: Self-pay

## 2018-01-17 ENCOUNTER — Inpatient Hospital Stay (HOSPITAL_COMMUNITY)
Admission: EM | Admit: 2018-01-17 | Discharge: 2018-01-20 | DRG: 194 | Disposition: A | Discharge status: Home / Self Care | Payer: Medicaid Other | Attending: Internal Medicine | Admitting: Internal Medicine

## 2018-01-17 DIAGNOSIS — J181 Lobar pneumonia, unspecified organism: Secondary | ICD-10-CM | POA: Diagnosis not present

## 2018-01-17 DIAGNOSIS — F329 Major depressive disorder, single episode, unspecified: Secondary | ICD-10-CM | POA: Diagnosis present

## 2018-01-17 DIAGNOSIS — Z86718 Personal history of other venous thrombosis and embolism: Secondary | ICD-10-CM

## 2018-01-17 DIAGNOSIS — D6851 Activated protein C resistance: Secondary | ICD-10-CM | POA: Diagnosis present

## 2018-01-17 DIAGNOSIS — Z8673 Personal history of transient ischemic attack (TIA), and cerebral infarction without residual deficits: Secondary | ICD-10-CM | POA: Diagnosis not present

## 2018-01-17 DIAGNOSIS — R042 Hemoptysis: Secondary | ICD-10-CM | POA: Diagnosis present

## 2018-01-17 DIAGNOSIS — Z91040 Latex allergy status: Secondary | ICD-10-CM

## 2018-01-17 DIAGNOSIS — G4700 Insomnia, unspecified: Secondary | ICD-10-CM | POA: Diagnosis present

## 2018-01-17 DIAGNOSIS — F419 Anxiety disorder, unspecified: Secondary | ICD-10-CM | POA: Diagnosis present

## 2018-01-17 DIAGNOSIS — Z79891 Long term (current) use of opiate analgesic: Secondary | ICD-10-CM | POA: Diagnosis not present

## 2018-01-17 DIAGNOSIS — Z7901 Long term (current) use of anticoagulants: Secondary | ICD-10-CM

## 2018-01-17 DIAGNOSIS — Z882 Allergy status to sulfonamides status: Secondary | ICD-10-CM | POA: Diagnosis not present

## 2018-01-17 DIAGNOSIS — Z888 Allergy status to other drugs, medicaments and biological substances status: Secondary | ICD-10-CM | POA: Diagnosis not present

## 2018-01-17 DIAGNOSIS — Z86711 Personal history of pulmonary embolism: Secondary | ICD-10-CM | POA: Diagnosis not present

## 2018-01-17 DIAGNOSIS — R791 Abnormal coagulation profile: Secondary | ICD-10-CM | POA: Diagnosis present

## 2018-01-17 DIAGNOSIS — E876 Hypokalemia: Secondary | ICD-10-CM | POA: Diagnosis present

## 2018-01-17 DIAGNOSIS — Z881 Allergy status to other antibiotic agents status: Secondary | ICD-10-CM | POA: Diagnosis not present

## 2018-01-17 DIAGNOSIS — Z79899 Other long term (current) drug therapy: Secondary | ICD-10-CM

## 2018-01-17 DIAGNOSIS — Z791 Long term (current) use of non-steroidal anti-inflammatories (NSAID): Secondary | ICD-10-CM

## 2018-01-17 DIAGNOSIS — D6859 Other primary thrombophilia: Secondary | ICD-10-CM | POA: Diagnosis present

## 2018-01-17 LAB — BASIC METABOLIC PANEL
Anion gap: 8 (ref 5–15)
BUN: 14 mg/dL (ref 6–20)
CO2: 26 mmol/L (ref 22–32)
Calcium: 8.3 mg/dL — ABNORMAL LOW (ref 8.9–10.3)
Chloride: 104 mmol/L (ref 98–111)
Creatinine, Ser: 0.9 mg/dL (ref 0.44–1.00)
GFR calc Af Amer: >60 mL/min (ref >60)
GLUCOSE: 95 mg/dL (ref 70–99)
Potassium: 3.8 mmol/L (ref 3.5–5.1)
Sodium: 138 mmol/L (ref 135–145)

## 2018-01-17 LAB — CBC WITH DIFFERENTIAL/PLATELET
ABS IMMATURE GRANULOCYTES: 0.04 10*3/uL (ref 0.00–0.07)
BASOS PCT: 0 %
Basophils Absolute: 0 10*3/uL (ref 0.0–0.1)
Eosinophils Absolute: 0.1 10*3/uL (ref 0.0–0.5)
Eosinophils Relative: 2 %
HCT: 36 % (ref 36.0–46.0)
Hemoglobin: 11.1 g/dL — ABNORMAL LOW (ref 12.0–15.0)
Immature Granulocytes: 1 %
Lymphocytes Relative: 23 %
Lymphs Abs: 1.8 10*3/uL (ref 0.7–4.0)
MCH: 27.1 pg (ref 26.0–34.0)
MCHC: 30.8 g/dL (ref 30.0–36.0)
MCV: 87.8 fL (ref 80.0–100.0)
MONO ABS: 0.7 10*3/uL (ref 0.1–1.0)
MONOS PCT: 9 %
NEUTROS ABS: 5.3 10*3/uL (ref 1.7–7.7)
Neutrophils Relative %: 65 %
PLATELETS: 374 10*3/uL (ref 150–400)
RBC: 4.1 MIL/uL (ref 3.87–5.11)
RDW: 17.6 % — ABNORMAL HIGH (ref 11.5–15.5)
WBC: 8 10*3/uL (ref 4.0–10.5)
nRBC: 0 % (ref 0.0–0.2)

## 2018-01-17 LAB — APTT
aPTT: 25 seconds (ref 24–36)
aPTT: 65 seconds — ABNORMAL HIGH (ref 24–36)

## 2018-01-17 LAB — PHOSPHORUS: Phosphorus: 3.7 mg/dL (ref 2.5–4.6)

## 2018-01-17 LAB — MRSA PCR SCREENING: MRSA by PCR: NEGATIVE

## 2018-01-17 LAB — HEPARIN LEVEL (UNFRACTIONATED)
Heparin Unfractionated: 0.56 IU/mL (ref 0.30–0.70)
Heparin Unfractionated: 0.69 IU/mL (ref 0.30–0.70)

## 2018-01-17 LAB — MAGNESIUM: Magnesium: 1.6 mg/dL — ABNORMAL LOW (ref 1.7–2.4)

## 2018-01-17 LAB — GLUCOSE, CAPILLARY: GLUCOSE-CAPILLARY: 76 mg/dL (ref 70–99)

## 2018-01-17 MED ORDER — SODIUM CHLORIDE 0.9 % IV SOLN
INTRAVENOUS | Status: DC
  Administered 2018-01-17 (×2): via INTRAVENOUS

## 2018-01-17 MED ORDER — OXYMETAZOLINE HCL 0.05 % NA SOLN
1.0000 | Freq: Two times a day (BID) | NASAL | Status: DC
  Administered 2018-01-17 – 2018-01-20 (×4): 1 via NASAL
  Filled 2018-01-17 (×2): qty 15

## 2018-01-17 MED ORDER — VENLAFAXINE HCL ER 75 MG PO CP24
225.0000 mg | ORAL_CAPSULE | Freq: Every day | ORAL | Status: DC
  Administered 2018-01-17 – 2018-01-19 (×3): 225 mg via ORAL
  Filled 2018-01-17: qty 3
  Filled 2018-01-17 (×2): qty 1
  Filled 2018-01-17: qty 3

## 2018-01-17 MED ORDER — IOPAMIDOL (ISOVUE-370) INJECTION 76%
INTRAVENOUS | Status: AC
  Filled 2018-01-17: qty 100

## 2018-01-17 MED ORDER — SODIUM CHLORIDE 0.9 % IV SOLN
1.0000 g | INTRAVENOUS | Status: DC
  Administered 2018-01-17 – 2018-01-18 (×2): 1 g via INTRAVENOUS
  Filled 2018-01-17: qty 10
  Filled 2018-01-17: qty 1

## 2018-01-17 MED ORDER — ORAL CARE MOUTH RINSE
15.0000 mL | Freq: Two times a day (BID) | OROMUCOSAL | Status: DC
  Administered 2018-01-17: 15 mL via OROMUCOSAL

## 2018-01-17 MED ORDER — QUETIAPINE FUMARATE 50 MG PO TABS
450.0000 mg | ORAL_TABLET | Freq: Every day | ORAL | Status: DC
  Administered 2018-01-17 – 2018-01-19 (×3): 450 mg via ORAL
  Filled 2018-01-17: qty 1
  Filled 2018-01-17 (×2): qty 2

## 2018-01-17 MED ORDER — BUSPIRONE HCL 10 MG PO TABS
10.0000 mg | ORAL_TABLET | Freq: Every day | ORAL | Status: DC
  Administered 2018-01-17 – 2018-01-19 (×3): 10 mg via ORAL
  Filled 2018-01-17: qty 1
  Filled 2018-01-17 (×2): qty 2

## 2018-01-17 MED ORDER — SIMETHICONE 80 MG PO CHEW
80.0000 mg | CHEWABLE_TABLET | Freq: Four times a day (QID) | ORAL | Status: DC | PRN
  Administered 2018-01-17 – 2018-01-19 (×8): 80 mg via ORAL
  Filled 2018-01-17 (×8): qty 1

## 2018-01-17 MED ORDER — FENTANYL CITRATE (PF) 100 MCG/2ML IJ SOLN
25.0000 ug | INTRAMUSCULAR | Status: DC | PRN
  Administered 2018-01-17 (×4): 50 ug via INTRAVENOUS
  Filled 2018-01-17 (×4): qty 2

## 2018-01-17 MED ORDER — HYDROXYZINE HCL 25 MG PO TABS: 25.0000 mg | ORAL_TABLET | Freq: Every evening | ORAL | Status: DC | PRN

## 2018-01-17 MED ORDER — PROMETHAZINE HCL 25 MG/ML IJ SOLN
25.0000 mg | Freq: Four times a day (QID) | INTRAMUSCULAR | Status: DC | PRN
  Administered 2018-01-17 – 2018-01-18 (×4): 25 mg via INTRAVENOUS
  Filled 2018-01-17 (×4): qty 1

## 2018-01-17 MED ORDER — ZOLPIDEM TARTRATE 5 MG PO TABS
2.5000 mg | ORAL_TABLET | Freq: Every evening | ORAL | Status: DC | PRN
  Administered 2018-01-17: 2.5 mg via ORAL
  Filled 2018-01-17: qty 1

## 2018-01-17 MED ORDER — BUSPIRONE HCL 5 MG PO TABS: 10.0000 mg | ORAL_TABLET | Freq: Every day | ORAL | Status: DC

## 2018-01-17 MED ORDER — MAGNESIUM SULFATE 2 GM/50ML IV SOLN
2.0000 g | Freq: Once | INTRAVENOUS | Status: AC
  Administered 2018-01-17: 2 g via INTRAVENOUS
  Filled 2018-01-17: qty 50

## 2018-01-17 MED ORDER — OXYCODONE HCL 5 MG PO TABS
10.0000 mg | ORAL_TABLET | Freq: Four times a day (QID) | ORAL | Status: DC | PRN
  Administered 2018-01-17 – 2018-01-20 (×12): 10 mg via ORAL
  Filled 2018-01-17 (×12): qty 2

## 2018-01-17 MED ORDER — CHLORHEXIDINE GLUCONATE 0.12 % MT SOLN
15.0000 mL | Freq: Two times a day (BID) | OROMUCOSAL | Status: DC
  Administered 2018-01-17 – 2018-01-18 (×2): 15 mL via OROMUCOSAL
  Filled 2018-01-17 (×4): qty 15

## 2018-01-17 MED ORDER — IOPAMIDOL (ISOVUE-370) INJECTION 76%
100.0000 mL | Freq: Once | INTRAVENOUS | Status: AC | PRN
  Administered 2018-01-17: 100 mL via INTRAVENOUS

## 2018-01-17 MED ORDER — HEPARIN (PORCINE) 25000 UT/250ML-% IV SOLN
1300.0000 [IU]/h | INTRAVENOUS | Status: DC
  Administered 2018-01-17 – 2018-01-20 (×4): 1300 [IU]/h via INTRAVENOUS
  Filled 2018-01-17 (×4): qty 250

## 2018-01-17 MED ORDER — MIRTAZAPINE 30 MG PO TABS
30.0000 mg | ORAL_TABLET | Freq: Every day | ORAL | Status: DC
  Administered 2018-01-17 – 2018-01-19 (×3): 30 mg via ORAL
  Filled 2018-01-17: qty 1
  Filled 2018-01-17 (×2): qty 2
  Filled 2018-01-17: qty 4
  Filled 2018-01-17: qty 1

## 2018-01-17 MED ORDER — FENTANYL CITRATE (PF) 100 MCG/2ML IJ SOLN: 25.0000 ug | INTRAMUSCULAR | Status: DC | PRN

## 2018-01-17 MED ORDER — PANTOPRAZOLE SODIUM 40 MG PO TBEC: 40.0000 mg | DELAYED_RELEASE_TABLET | Freq: Two times a day (BID) | ORAL | Status: DC

## 2018-01-17 NOTE — Progress Notes (Addendum)
[facility]Link Physician-Brief Progress Note Patient Name: Colleen Olsen DOB: 10/16/1970 MRN: [id number]   Date of Service  01/17/2018  HPI/Events of Note  48 yr old female accepted earlier, discussed with ED docs, NP. Admitted for Sub massive, stable Hemoptysis ( around total 160 ml yesterday). 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 CxR ? RLL prominence vascular marking. No fever. Wbc normal. BP fine.   Camera evaluation: stable VS. Discussed about elective bronch. IR going to see in AM. Watch full watching for now. No more hemopotysis since admission here. Hg did not drop much.     eICU Interventions  Continue care Consider CTA, or Bronch to localize bleeding. In emergency, need to isolate both lung . Asp precautions.  Hold xarelto. Last dose over 24 hrs. Did not reverse it.      Intervention Category Minor Interventions: Communication with other healthcare providers and/or family Evaluation Type: New Patient Evaluation  Ranee Gosselin 01/17/2018, 6:39 AM

## 2018-01-17 NOTE — Progress Notes (Addendum)
ANTICOAGULATION CONSULT NOTE - Initial Consult  Pharmacy Consult for heparin Indication: pulmonary embolus  Patient Measurements: Heparin Dosing Weight:   Vital Signs: Temp: 97.4 F (36.3 C) (01/03 1132) Temp Source: Axillary (01/03 1132) BP: 128/86 (01/03 1000) Pulse Rate: 95 (01/03 1000)  Labs: Recent Labs    01/17/18 0311  HGB 11.1*  HCT 36.0  PLT 374  CREATININE 0.90    Medical History: Past Medical History:  Diagnosis Date  . Anemia   . Arthritis   . Depression   . Headache(784.0)   . Shortness of breath     Assessment: Admitted with hemoptysis > now resolved. 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 She was taking Xarelto for a previous PE diagnosed in April 2019. H/h, plts wnl. Baseline aPTT + HL have been drawn. Starting a heparin infusion until CTA rules out an acute PE.    Goal of Therapy:  Heparin level 0.3-0.7 units/ml Monitor platelets by anticoagulation protocol: Yes    Plan:  -Heparin infusion at 1300 units/hr, hold heparin bolus given hemoptysis -Level in 6 hours -Daily HL, aPTT, CBC   Carlee Tesfaye, Darl Householder 01/17/2018,1:39 PM

## 2018-01-17 NOTE — Progress Notes (Signed)
Colleen Olsen  FAO:[id number] DOB: 11/28/1970 DOA: 01/17/2018 PCP: Dema SeverinYork, Regina F, NP    Reason for Consult / Chief Complaint:  hemoptysis  Consulting MD:  Dr. Mateo FlowWael Aljishi  HPI/Brief Narrative   48 yo female with PMH Factor II deficiency, factor V leiden, and PE in 04/2017 on rivaroxaban [facility] after hemoptysis x3 with 140cc total. [facility] with IR consultation for possible emoblization.   Subjective  Patient is feeling well this morning. Endorses mild nausea and abdominal pain and SOB. Denies chest pain, weakness, dizziness. Last BM yesterday without BRB. States this has never happened before and endorses compliance with her rivaroxaban.   Assessment & Plan:  - stable for transfer to floor - consult IM  - CT chest  - IR consulted will see this am  - start PPI for possible GI source  - advance diet after IR sees  - repleting Mg   Best Practice / Goals of Care / Disposition.   DVT prophylaxis: SCDs GI prophylaxis: PPI  Diet: NPO  Mobility: mobile Code Status: full  Family Communication: none   Disposition / Summary of Today's Plan 01/17/18   Patient is stable for transfer to floor. She is feeling well and will need further work up for her hemoptysis.   Consultants:  IR  Procedures: none  Significant Diagnostic Tests: none  Micro Data: none  Antimicrobials:  none   Objective    Examination: General: NAD, sitting up in bed HENT: Ke[place], AT Lungs: CTAB, no w/r/r Cardiovascular: tachycardic, regular rhythm, no m/r/g Abdomen: +BS, soft, non-distended, TTP epigastric region Extremities: warm, no edema Neuro: a&ox3, cooperative, normal affect  Blood pressure 116/87, pulse 85, temperature 98.4 F (36.9 C), resp. rate 13, SpO2 100 %. . sodium chloride 50 mL/hr at 01/17/18 0600  . magnesium sulfate 1 - 4 g bolus IVPB            Intake/Output Summary (Last 24 hours) at 01/17/2018 0757 Last data filed at 01/17/2018  0600 Gross per 24 hour  Intake 83.65 ml  Output -  Net 83.65 ml   There were no vitals filed for this visit.   Labs   CBC: Recent Labs  Lab 01/17/18 0311  WBC 8.0  NEUTROABS 5.3  HGB 11.1*  HCT 36.0  MCV 87.8  PLT 374   Basic Metabolic Panel: Recent Labs  Lab 01/17/18 0311  NA 138  K 3.8  CL 104  CO2 26  GLUCOSE 95  BUN 14  CREATININE 0.90  CALCIUM 8.3*  MG 1.6*  PHOS 3.7   GFR: CrCl cannot be calculated (Unknown ideal weight.). Recent Labs  Lab 01/17/18 0311  WBC 8.0   Liver Function Tests: No results for input(s): AST, ALT, ALKPHOS, BILITOT, PROT, ALBUMIN in the last 168 hours. No results for input(s): LIPASE, AMYLASE in the last 168 hours. No results for input(s): AMMONIA in the last 168 hours. ABG No results found for: PHART, PCO2ART, PO2ART, HCO3, TCO2, ACIDBASEDEF, O2SAT  Coagulation Profile: No results for input(s): INR, PROTIME in the last 168 hours. Cardiac Enzymes: No results for input(s): CKTOTAL, CKMB, CKMBINDEX, TROPONINI in the last 168 hours. HbA1C: No results found for: HGBA1C CBG: Recent Labs  Lab 01/17/18 0548  GLUCAP 76     Review of Systems:     Past medical history   Past Medical History:  Diagnosis Date  . Anemia   . Arthritis   . Depression   . Headache(784.0)   .  Shortness of breath    Social History   Social History   Socioeconomic History  . Marital status: Married    Spouse name: Not on file  . Number of children: Not on file  . Years of education: Not on file  . Highest education level: Not on file  Occupational History  . Not on file  Social Needs  . Financial resource strain: Not on file  . Food insecurity:    Worry: Not on file    Inability: Not on file  . Transportation needs:    Medical: Not on file    Non-medical: Not on file  Tobacco Use  . Smoking status: Never Smoker  . Smokeless tobacco: Never Used  Substance and Sexual Activity  . Alcohol use: No  . Drug use: No  . Sexual  activity: Not on file  Lifestyle  . Physical activity:    Days per week: Not on file    Minutes per session: Not on file  . Stress: Not on file  Relationships  . Social connections:    Talks on phone: Not on file    Gets together: Not on file    Attends religious service: Not on file    Active member of club or organization: Not on file    Attends meetings of clubs or organizations: Not on file    Relationship status: Not on file  . Intimate partner violence:    Fear of current or ex partner: Not on file    Emotionally abused: Not on file    Physically abused: Not on file    Forced sexual activity: Not on file  Other Topics Concern  . Not on file  Social History Narrative  . Not on file    Family history    Family History  Problem Relation Age of Onset  . Bradycardia Father        Pacemaker  . Hypertension Father   . Hypertension Mother   . Lung cancer Mother        Deceased    Allergies Allergies  Allergen Reactions  . Doxycycline     confusion  . Latex     Hives, burning  . Paxil [Paroxetine Hcl]     Hives, itching  . Prozac [Fluoxetine Hcl]     Hives, itching  . Sulfa Antibiotics     Hives     Home meds  Prior to Admission medications   Medication Sig Start Date End Date Taking? Authorizing Provider  aspirin EC 81 MG tablet Take 81 mg by mouth daily.   Yes [provider]  busPIRone (BUSPAR) 10 MG tablet Take 50 mg by mouth daily.   Yes [provider]  eszopiclone (LUNESTA) 2 MG TABS tablet Take 6 mg by mouth at bedtime as needed for sleep. Take immediately before bedtime   Yes [provider]  gabapentin (NEURONTIN) 600 MG tablet Take 600 mg by mouth 4 (four) times daily as needed (pain).    Yes [provider]  hydrochlorothiazide (HYDRODIURIL) 25 MG tablet Take 25 mg by mouth daily as needed (anxiety).   Yes [provider]  mirtazapine (REMERON) 45 MG tablet Take 45 mg by mouth at bedtime.   Yes [provider]  Multiple Vitamin (MULTIVITAMIN WITH MINERALS) TABS tablet Take 1 tablet by mouth daily.   Yes [provider]  polyethylene glycol (MIRALAX / GLYCOLAX) packet Take 17 g by mouth daily as needed for moderate constipation.   Yes  [provider]  QUEtiapine (SEROQUEL) 100 MG tablet Take 450 mg by mouth at bedtime.   Yes [provider]  rivaroxaban (XARELTO) 20 MG TABS tablet Take 20 mg by mouth daily with supper.   Yes [provider]  venlafaxine XR (EFFEXOR-XR) 150 MG 24 hr capsule Take 150 mg by mouth daily with breakfast.   Yes [provider]  levofloxacin (LEVAQUIN) 750 MG tablet Take 1 tablet (750 mg total) by mouth daily. Patient not taking: Reported on 01/17/2018 09/09/12   Rhetta Mura, MD  nystatin (MYCOSTATIN) 100000 UNIT/ML suspension Take 5 mLs (500,000 Units total) by mouth 4 (four) times daily. Patient not taking: Reported on 01/17/2018 09/09/12   Rhetta Mura, MD     LOS: 0 days

## 2018-01-17 NOTE — ED Triage Notes (Signed)
Pt arrived [facility] from [facility] ED for c/o hemoptysis. Pt has a hx of blood clots and factor V and II clotting disorders, currently taking xarelto (last taken at 01/15/17 at 2145) per PT they attempted to lay pt flat for CTA and she was unable to tolerate it. BP 137/80 P100 O2 96% RA

## 2018-01-17 NOTE — Progress Notes (Addendum)
ANTICOAGULATION CONSULT NOTE - Initial Consult  Pharmacy Consult for heparin Indication: pulmonary embolus  Patient Measurements: Heparin Dosing Weight: 82 kg  Vital Signs: Temp: 98 F (36.7 C) (01/03 2018) Temp Source: Oral (01/03 2018) BP: 116/71 (01/03 1800) Pulse Rate: 91 (01/03 1800)  Labs: Recent Labs    01/17/18 0311 01/17/18 1434 01/17/18 2057  HGB 11.1*  --   --   HCT 36.0  --   --   PLT 374  --   --   APTT  --  25  --   HEPARINUNFRC  --  0.56 0.69  CREATININE 0.90  --   --     Medical History: Past Medical History:  Diagnosis Date  . Anemia   . Arthritis   . Depression   . Headache(784.0)   . Shortness of breath     Assessment: Admitted with hemoptysis > now resolved. 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 She was taking Xarelto for a previous PE diagnosed in April 2019. H/h, plts wnl. Baseline aPTT + HL have been drawn. Starting a heparin infusion until CTA rules out an acute PE.   Baseline aPTT = 25, anti-Xa level = 0.56, prior to heparin was started. Noted on xarelto PTA, last dose was 1/1. 6 hr anti-Xa level = 0.69.    Goal of Therapy:  APTT = 66 - 102 Heparin level 0.3-0.7 units/ml Monitor platelets by anticoagulation protocol: Yes    Plan:  -Continue Heparin infusion at 1300 units/hr, hold heparin bolus given hemoptysis -Add on aPTT level to confirm -Daily HL, aPTT, CBC  Bayard Hugger, PharmD, BCPS, BCPPS Clinical Pharmacist  Pager: [phone]   01/17/2018,10:23 PM   Addendum: aPTT = 65, close to therapeutic range. No change for heparin rate for now f/u AM labs.  Bayard Hugger, PharmD, BCPS, BCPPS Clinical Pharmacist  Pager: [phone]

## 2018-01-17 NOTE — ED Provider Notes (Signed)
[facility] Provider Note   CSN: [id number] Arrival date & time: 01/17/18  0220     History   Chief Complaint Chief Complaint  Patient presents with  . Hemoptysis    HPI Colleen Olsen is a 48 y.o. female.  The history is provided by the patient.  She has history of stroke, pulmonary [place], factor V deficiency and is chronically anticoagulated on rivaroxaban and is [facility] where she presented with hemoptysis.  ED physician there states that she personally witnessed 100mL blood being coughed up.  Patient had chest x-ray which showed haziness in the right lower lobe, but was unable to lay flat for CT scan because she felt as if she was choking.  She has not had hemoptysis like this before.  Last dose of rivaroxaban was on January 1 at 2145.  Past Medical History:  Diagnosis Date  . Anemia   . Arthritis   . Depression   . Headache(784.0)   . Shortness of breath     Patient Active Problem List   Diagnosis Date Noted  . HCAP (healthcare-associated pneumonia) 09/05/2012    Past Surgical History:  Procedure Laterality Date  . ABDOMINAL HYSTERECTOMY    . CHOLECYSTECTOMY    . gastri bypass 2003    . TONSILLECTOMY       OB History   No obstetric history on file.     Home Medications    Prior to Admission medications   Medication Sig Start Date End Date Taking? Authorizing Provider  cyanocobalamin (,VITAMIN B-12,) 1000 MCG/ML injection Inject 1,000 mcg into the skin every 30 (thirty) days.    [provider]  DULoxetine (CYMBALTA) 60 MG capsule Take 60 mg by mouth at bedtime.    [provider]  gabapentin (NEURONTIN) 600 MG tablet Take 600 mg by mouth 4 (four) times daily.    [provider]  levofloxacin (LEVAQUIN) 750 MG tablet Take 1 tablet (750 mg total) by mouth daily. 09/09/12   Rhetta MuraSamtani, Jai-Gurmukh, MD  meloxicam (MOBIC) 15 MG tablet Take 15 mg by mouth at  bedtime.    [provider]  morphine (MSIR) 15 MG tablet Take 15 mg by mouth 3 (three) times daily.    [provider]  nystatin (MYCOSTATIN) 100000 UNIT/ML suspension Take 5 mLs (500,000 Units total) by mouth 4 (four) times daily. 09/09/12   Rhetta MuraSamtani, Jai-Gurmukh, MD  topiramate (TOPAMAX) 100 MG tablet Take 100 mg by mouth at bedtime.    [provider]    Family History Family History  Problem Relation Age of Onset  . Bradycardia Father        Pacemaker  . Hypertension Father   . Hypertension Mother   . Lung cancer Mother        Deceased    Social History Social History   Tobacco Use  . Smoking status: Never Smoker  . Smokeless tobacco: Never Used  Substance Use Topics  . Alcohol use: No  . Drug use: No     Allergies   Doxycycline; Latex; Paxil [paroxetine hcl]; Prozac [fluoxetine hcl]; and Sulfa antibiotics   Review of Systems Review of Systems  All other systems reviewed and are negative.    Physical Exam Updated Vital Signs BP 102/77   Pulse 90   Temp 98.4 F (36.9 C)   Resp (!) 22   SpO2 97%   Physical Exam Vitals signs and nursing note reviewed.    48 year old  female, resting comfortably and in no acute distress. Vital signs are significant for elevated respiratory rate. Oxygen saturation is 97%, which is normal. Head is normocephalic and atraumatic. PERRLA, EOMI. Oropharynx is clear. Neck is nontender and supple without adenopathy or JVD. Back is nontender and there is no CVA tenderness. Lungs are clear without rales, wheezes, or rhonchi. Chest is nontender. Heart has regular rate and rhythm without murmur. Abdomen is soft, flat, nontender without masses or hepatosplenomegaly and peristalsis is normoactive. Extremities have no cyanosis or edema, full range of motion is present. Skin is warm and dry without rash. Neurologic: Mental status is normal, cranial nerves are intact, there are no motor or sensory deficits.  ED  Treatments / Results  Labs (all labs ordered are listed, but only abnormal results are displayed) Labs Reviewed  BASIC METABOLIC PANEL  CBC WITH DIFFERENTIAL/PLATELET   Procedures Procedures  CRITICAL CARE Performed by: Dione Booze Total critical care time: 40 minutes Critical care time was exclusive of separately billable procedures and treating other patients. Critical care was necessary to treat or prevent imminent or life-threatening deterioration. Critical care was time spent personally by me on the following activities: development of treatment plan with patient and/or surrogate as well as nursing, discussions with consultants, evaluation of patient's response to treatment, examination of patient, obtaining history from patient or surrogate, ordering and performing treatments and interventions, ordering and review of laboratory studies, ordering and review of radiographic studies, pulse oximetry and re-evaluation of patient's condition.  Medications Ordered in ED Medications - No data to display   Initial Impression / Assessment and Plan / ED Course  I have reviewed the triage vital signs and the nursing notes.  Pertinent lab results that were available during my care of the patient were reviewed by me and considered in my medical decision making (see chart for details).  Patient with submassive hemoptysis, anticoagulated on rivaroxaban.  Given her underlying hypercoagulable state, and the fact that she is not having massive hemoptysis, I do not feel she is a candidate for the rapid reversal of anticoagulant protocol.  I discussed the case with Dr. Grace Isaac of interventional radiology, who feels that the patient can be observed and he will evaluate her in the morning to see if procedure is indicated.  Case is discussed with Dr. Marlane Mingle of critical care service who agrees to admit the patient.  Final Clinical Impressions(s) / ED Diagnoses   Final diagnoses:  Hemoptysis  Anticoagulant  long-term use    ED Discharge Orders    None       Dione Booze, MD 01/17/18 [phone]

## 2018-01-17 NOTE — H&P (Signed)
NAME:  Colleen Olsen, MRN:  [id number], DOB:  05/15/1970, LOS: 0 ADMISSION DATE:  01/17/2018, CONSULTATION DATE:  01/17/18 REFERRING MD:  Preston FleetingGlick  CHIEF COMPLAINT:  Hemoptysis   Brief History   Colleen Olsen is a 48 y.o. female who was [facility] from [facility] due to concern she would need IR intervention for hemoptysis.  Pt is on rivaroxaban for PE (diagnosed April 2019) and Factor V Leiden deficiency and started having hemoptysis 1/2.  History of present illness   Colleen Olsen is a 48 y.o. female who has a PMH as outlined below (see "past medical history").  [facility] 1[facility]2 after she had an episode of hemoptysis at home which was roughly 20cc in volume.  While in ED, she had an additional 2 episodes each around 20cc in volume.  After being taken back to a room, EDP witnessed a 3rd episode which was much larger, at least 100cc.  EDP was concerned that she would require IR embolization; therefore, [facility].  Of note, pt was diagnosed with PE in April 2019 which was attributed to Factor V Leiden deficiency.  She has not had any bleeding problems since then.  She had a general cold about 1 week prior to presentation and had mild cough with this.  Denies any hemoptysis at the time.  Currently comfortable with stable vitals.  [facility] at 12 and [facility]. She last had a small episode of hemoptysis roughly 2 hours prior to ED arrival [facility].  IR was consulted by EDP and felt that pt could be managed conservatively for now and they will formally see her in the morning.  Past Medical History  PE, factor V leiden, anxiety, depression.  Significant Hospital Events   1/3 > admit.  Consults:  IR.  Procedures:  None.  Significant Diagnostic Tests:  CXR 1/3 >   Micro Data:  None.  Antimicrobials:  None.   Interim history/subjective:  Comfortable.  No further hemoptysis since [facility].  Objective:  Blood  pressure (!) 118/94, pulse 90, temperature 98.4 F (36.9 C), resp. rate 19, SpO2 97 %.       No intake or output data in the 24 hours ending 01/17/18 0344 There were no vitals filed for this visit.  Examination: General: Adult female, in NAD. Neuro: A&O x 3, no deficits. HEENT: [place]/AT. Sclerae anicteric.  EOMI. Cardiovascular: RRR, no M/R/G.  Lungs: Respirations even and unlabored.  CTA bilaterally, No W/R/R.  Abdomen: BS x 4, soft, NT/ND.  Musculoskeletal: No gross deformities, no edema.  Skin: Intact, warm, no rashes.  Assessment & Plan:   Hemoptysis - unclear etiology.  Pt noted to be on rivaroxaban for the past 8 months or so without issue. - Hold rivaroxaban. - IR consulted, will see pt in AM. - Might need CTA vs embolization.  Anxiety / Depression - pt states she "does not do well off of her meds" and is requesting that they be restarted tonight. - Continue preadmission quetiapine, mirtazapine, hydroxyzine, venlafaxine, ambien (in lieu of preadmission lunesta).  Hx PE and factor V Leiden - on rivaroxaban. - Hold rivaroxaban as above.  Best Practice:  Diet: NPO. Pain/Anxiety/Delirium protocol (if indicated): N/A. VAP protocol (if indicated): N/A. DVT prophylaxis: SCD's. GI prophylaxis: N/A. Glucose control: N/A. Mobility: Bedrest. Code Status: Full. Family Communication: Husband updated at bedside. Disposition: ICU.  Labs   CBC: Recent Labs  Lab 01/17/18 0311  WBC 8.0  NEUTROABS 5.3  HGB 11.1*  HCT 36.0  MCV 87.8  PLT 374   Basic Metabolic Panel: No results for input(s): NA, K, CL, CO2, GLUCOSE, BUN, CREATININE, CALCIUM, MG, PHOS in the last 168 hours. GFR: CrCl cannot be calculated (Patient's most recent lab result is older than the maximum 21 days allowed.). Recent Labs  Lab 01/17/18 0311  WBC 8.0   Liver Function Tests: No results for input(s): AST, ALT, ALKPHOS, BILITOT, PROT, ALBUMIN in the last 168 hours. No results for input(s): LIPASE,  AMYLASE in the last 168 hours. No results for input(s): AMMONIA in the last 168 hours. ABG No results found for: PHART, PCO2ART, PO2ART, HCO3, TCO2, ACIDBASEDEF, O2SAT  Coagulation Profile: No results for input(s): INR, PROTIME in the last 168 hours. Cardiac Enzymes: No results for input(s): CKTOTAL, CKMB, CKMBINDEX, TROPONINI in the last 168 hours. HbA1C: No results found for: HGBA1C CBG: No results for input(s): GLUCAP in the last 168 hours.  Review of Systems:   All negative; except for those that are bolded, which indicate positives.  Constitutional: weight loss, weight gain, night sweats, fevers, chills, fatigue, weakness.  HEENT: headaches, sore throat, sneezing, nasal congestion, post nasal drip, difficulty swallowing, tooth/dental problems, visual complaints, visual changes, ear aches. Neuro: difficulty with speech, weakness, numbness, ataxia. CV:  chest pain, orthopnea, PND, swelling in lower extremities, dizziness, palpitations, syncope.  Resp: cough, hemoptysis, dyspnea, wheezing. GI: heartburn, indigestion, abdominal pain, nausea, vomiting, diarrhea, constipation, change in bowel habits, loss of appetite, hematemesis, melena, hematochezia.  GU: dysuria, change in color of urine, urgency or frequency, flank pain, hematuria. MSK: joint pain or swelling, decreased range of motion. Psych: change in mood or affect, depression, anxiety, suicidal ideations, homicidal ideations. Skin: rash, itching, bruising.   Past medical history  She,  has a past medical history of Anemia, Arthritis, Depression, Headache(784.0), and Shortness of breath.   Surgical History    Past Surgical History:  Procedure Laterality Date  . ABDOMINAL HYSTERECTOMY    . CHOLECYSTECTOMY    . gastri bypass 2003    . TONSILLECTOMY       Social History   reports that she has never smoked. She has never used smokeless tobacco. She reports that she does not drink alcohol or use drugs.   Family history     Her family history includes Bradycardia in her father; Hypertension in her father and mother; Lung cancer in her mother.   Allergies Allergies  Allergen Reactions  . Doxycycline     confusion  . Latex     Hives, burning  . Paxil [Paroxetine Hcl]     Hives, itching  . Prozac [Fluoxetine Hcl]     Hives, itching  . Sulfa Antibiotics     Hives      Home meds  Prior to Admission medications   Medication Sig Start Date End Date Taking? Authorizing Provider  cyanocobalamin (,VITAMIN B-12,) 1000 MCG/ML injection Inject 1,000 mcg into the skin every 30 (thirty) days.    [provider]  DULoxetine (CYMBALTA) 60 MG capsule Take 60 mg by mouth at bedtime.    [provider]  gabapentin (NEURONTIN) 600 MG tablet Take 600 mg by mouth 4 (four) times daily.    [provider]  levofloxacin (LEVAQUIN) 750 MG tablet Take 1 tablet (750 mg total) by mouth daily. 09/09/12   Rhetta Mura, MD  meloxicam (MOBIC) 15 MG tablet Take 15 mg by mouth at bedtime.  [provider]  morphine (MSIR) 15 MG tablet Take 15 mg by mouth 3 (three) times daily.    [provider]  nystatin (MYCOSTATIN) 100000 UNIT/ML suspension Take 5 mLs (500,000 Units total) by mouth 4 (four) times daily. 09/09/12   Rhetta MuraSamtani, Jai-Gurmukh, MD  topiramate (TOPAMAX) 100 MG tablet Take 100 mg by mouth at bedtime.    [provider]    Rutherford Guysahul Carlin Attridge, PA - [facility] [place] [facility] Pager: [phone].  If no answer, [phone] 01/17/2018, 3:44 AM

## 2018-01-17 NOTE — ED Notes (Signed)
Patient called out requesting medication for pain and nausea

## 2018-01-18 DIAGNOSIS — G4700 Insomnia, unspecified: Secondary | ICD-10-CM

## 2018-01-18 DIAGNOSIS — J9601 Acute respiratory failure with hypoxia: Secondary | ICD-10-CM

## 2018-01-18 LAB — MAGNESIUM: Magnesium: 1.9 mg/dL (ref 1.7–2.4)

## 2018-01-18 LAB — HEPARIN LEVEL (UNFRACTIONATED): Heparin Unfractionated: 0.76 IU/mL — ABNORMAL HIGH (ref 0.30–0.70)

## 2018-01-18 LAB — CBC
HEMATOCRIT: 36.9 % (ref 36.0–46.0)
Hemoglobin: 11.5 g/dL — ABNORMAL LOW (ref 12.0–15.0)
MCH: 27 pg (ref 26.0–34.0)
MCHC: 31.2 g/dL (ref 30.0–36.0)
MCV: 86.6 fL (ref 80.0–100.0)
Platelets: 314 10*3/uL (ref 150–400)
RBC: 4.26 MIL/uL (ref 3.87–5.11)
RDW: 17.5 % — ABNORMAL HIGH (ref 11.5–15.5)
WBC: 7.8 10*3/uL (ref 4.0–10.5)
nRBC: 0 % (ref 0.0–0.2)

## 2018-01-18 LAB — BASIC METABOLIC PANEL
Anion gap: 10 (ref 5–15)
BUN: 8 mg/dL (ref 6–20)
CALCIUM: 8.2 mg/dL — AB (ref 8.9–10.3)
CO2: 23 mmol/L (ref 22–32)
Chloride: 105 mmol/L (ref 98–111)
Creatinine, Ser: 0.84 mg/dL (ref 0.44–1.00)
GFR calc Af Amer: >60 mL/min (ref >60)
GFR calc non Af Amer: >60 mL/min (ref >60)
Glucose, Bld: 86 mg/dL (ref 70–99)
Potassium: 3.4 mmol/L — ABNORMAL LOW (ref 3.5–5.1)
Sodium: 138 mmol/L (ref 135–145)

## 2018-01-18 LAB — APTT: aPTT: 80 seconds — ABNORMAL HIGH (ref 24–36)

## 2018-01-18 MED ORDER — POLYETHYLENE GLYCOL 3350 17 G PO PACK
17.0000 g | PACK | Freq: Every day | ORAL | Status: DC
  Administered 2018-01-18 – 2018-01-19 (×2): 17 g via ORAL
  Filled 2018-01-18 (×2): qty 1

## 2018-01-18 MED ORDER — ONDANSETRON HCL 4 MG/2ML IJ SOLN
4.0000 mg | Freq: Four times a day (QID) | INTRAMUSCULAR | Status: DC | PRN
  Administered 2018-01-18 – 2018-01-20 (×4): 4 mg via INTRAVENOUS
  Filled 2018-01-18 (×4): qty 2

## 2018-01-18 MED ORDER — SODIUM CHLORIDE 0.9 % IV SOLN
2.0000 g | INTRAVENOUS | Status: DC
  Administered 2018-01-19: 2 g via INTRAVENOUS
  Filled 2018-01-18 (×2): qty 20

## 2018-01-18 MED ORDER — ZOLPIDEM TARTRATE 5 MG PO TABS
5.0000 mg | ORAL_TABLET | Freq: Every evening | ORAL | Status: DC | PRN
  Administered 2018-01-18: 5 mg via ORAL
  Filled 2018-01-18: qty 1

## 2018-01-18 NOTE — Progress Notes (Signed)
NAME:  Colleen Olsen, MRN:  [id number], DOB:  02/23/1970, LOS: 1 ADMISSION DATE:  01/17/2018, CONSULTATION DATE:  1/3 REFERRING MD:  OSH MD, CHIEF COMPLAINT:  Hemoptysis   Brief History   48 y/o female with a history of DVT and mutliple strokes presented with hemoptysis in the setting of bronchitis.   Past Medical History  Clotting disorder: Factor V Leiden and Prothrombin gene mutation PE Stroke PFO  Significant Hospital Events     Consults:    Procedures:  None  Significant Diagnostic Tests:  1/3 CT angiogram: No PE, large RLL infiltrate felt to be pnaeumonia  Micro Data:    Antimicrobials:  1/3 ceftriaxone >   Interim history/subjective:  Feels OK Some bloating and constipation Eating No hemoptysis  Objective   Blood pressure 135/90, pulse 98, temperature 98.2 F (36.8 C), temperature source Oral, resp. rate 13, height 5\' 7"  (1.702 m), weight 94.4 kg, SpO2 97 %.        Intake/Output Summary (Last 24 hours) at 01/18/2018 1240 Last data filed at 01/18/2018 0907 Gross per 24 hour  Intake 627.99 ml  Output 1400 ml  Net -772.01 ml   Filed Weights   01/17/18 1400 01/18/18 0500  Weight: 92.8 kg 94.4 kg    Examination:  General:  Resting comfortably in bed HENT: NCAT OP clear PULM: CTA B, normal effort CV: RRR, no mgr GI: BS+, soft, nontender MSK: normal bulk and tone Neuro: awake, alert, no distress, MAEW   Resolved Hospital Problem list     Assessment & Plan:  PE April 2019, history of multiple DVT's and clotting disorder: needs lifelong anticoagulation, but had clinically significant hemoptysis on 1/2 > cautiously continue heparin > restart Xarelto on 1/6 if stable  Hemoptysis > if recurs stop heparin > if massive turn R side down and discuss embolization with IR to RLL (though would prefer to localize with IR) > if recurs then bronchoscopy  Pneumonia > continue ceftriaxone 2gm IV q24h  Bloating/consipation: worse > add miralax >  simethicone  Insomnia: > increase ambien to 5mg  qHS  Best practice:  Diet: regular Pain/Anxiety/Delirium protocol (if indicated): n/a VAP protocol (if indicated): n/a DVT prophylaxis: heparin GI prophylaxis: n/a Glucose control: monitor  Mobility: out of bed Code Status: full Family Communication: updated husband 1/4 Disposition: to floor, TRH  Labs   CBC: Recent Labs  Lab 01/17/18 0311 01/18/18 0236  WBC 8.0 7.8  NEUTROABS 5.3  --   HGB 11.1* 11.5*  HCT 36.0 36.9  MCV 87.8 86.6  PLT 374 314    Basic Metabolic Panel: Recent Labs  Lab 01/17/18 0311 01/18/18 0236  NA 138 138  K 3.8 3.4*  CL 104 105  CO2 26 23  GLUCOSE 95 86  BUN 14 8  CREATININE 0.90 0.84  CALCIUM 8.3* 8.2*  MG 1.6* 1.9  PHOS 3.7  --    GFR: Estimated Creatinine Clearance: 98.7 mL/min (by C-G formula based on SCr of 0.84 mg/dL). Recent Labs  Lab 01/17/18 0311 01/18/18 0236  WBC 8.0 7.8    Liver Function Tests: No results for input(s): AST, ALT, ALKPHOS, BILITOT, PROT, ALBUMIN in the last 168 hours. No results for input(s): LIPASE, AMYLASE in the last 168 hours. No results for input(s): AMMONIA in the last 168 hours.  ABG No results found for: PHART, PCO2ART, PO2ART, HCO3, TCO2, ACIDBASEDEF, O2SAT   Coagulation Profile: No results for input(s): INR, PROTIME in the last 168 hours.  Cardiac Enzymes: No results for input(s):  CKTOTAL, CKMB, CKMBINDEX, TROPONINI in the last 168 hours.  HbA1C: No results found for: HGBA1C  CBG: Recent Labs  Lab 01/17/18 0548  GLUCAP 76    Heber Rice, MD [facility] Pager: [phone] Cell: [phone] If no response, call [phone]

## 2018-01-18 NOTE — Progress Notes (Signed)
ANTICOAGULATION CONSULT NOTE   Pharmacy Consult for Heparin Indication: pulmonary embolus  Patient Measurements: Heparin Dosing Weight: 82 kg  Vital Signs: Temp: 98 F (36.7 C) (01/03 2348) Temp Source: Oral (01/03 2348) BP: 125/89 (01/04 0300) Pulse Rate: 95 (01/04 0300)  Labs: Recent Labs    01/17/18 0311 01/17/18 1434 01/17/18 2057 01/18/18 0236  HGB 11.1*  --   --  11.5*  HCT 36.0  --   --  36.9  PLT 374  --   --  314  APTT  --  25 65* 80*  HEPARINUNFRC  --  0.56 0.69 0.76*  CREATININE 0.90  --   --  0.84    Medical History: Past Medical History:  Diagnosis Date  . Anemia   . Arthritis   . Depression   . Headache(784.0)   . Shortness of breath     Assessment: Admitted with hemoptysis > now resolved. 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 She was taking Xarelto for a previous PE diagnosed in April 2019. H/h, plts wnl. Baseline aPTT + HL have been drawn. Starting a heparin infusion until CTA rules out an acute PE.    1/4 AM update: aPTT therapeutic x 2, CT angio is negative for acute PE  Goal of Therapy:  APTT 66-102 secs Heparin level 0.3-0.7 units/ml Monitor platelets by anticoagulation protocol: Yes    Plan:  -Continue Heparin infusion at 1300 units/hr -Daily HL, aPTT, CBC -Can likely change back to Xarelto later today>>>CT Angio negative for acute PE  Abran Duke, PharmD, BCPS Clinical Pharmacist Phone: [phone]

## 2018-01-19 LAB — BASIC METABOLIC PANEL
Anion gap: 9 (ref 5–15)
BUN: <5 mg/dL — ABNORMAL LOW (ref 6–20)
CO2: 26 mmol/L (ref 22–32)
CREATININE: 0.7 mg/dL (ref 0.44–1.00)
Calcium: 8.2 mg/dL — ABNORMAL LOW (ref 8.9–10.3)
Chloride: 103 mmol/L (ref 98–111)
GFR calc Af Amer: >60 mL/min (ref >60)
GFR calc non Af Amer: >60 mL/min (ref >60)
Glucose, Bld: 90 mg/dL (ref 70–99)
Potassium: 3.1 mmol/L — ABNORMAL LOW (ref 3.5–5.1)
Sodium: 138 mmol/L (ref 135–145)

## 2018-01-19 LAB — CBC
HCT: 33.7 % — ABNORMAL LOW (ref 36.0–46.0)
Hemoglobin: 10.6 g/dL — ABNORMAL LOW (ref 12.0–15.0)
MCH: 27 pg (ref 26.0–34.0)
MCHC: 31.5 g/dL (ref 30.0–36.0)
MCV: 86 fL (ref 80.0–100.0)
NRBC: 0 % (ref 0.0–0.2)
PLATELETS: 305 10*3/uL (ref 150–400)
RBC: 3.92 MIL/uL (ref 3.87–5.11)
RDW: 17.9 % — AB (ref 11.5–15.5)
WBC: 9.9 10*3/uL (ref 4.0–10.5)

## 2018-01-19 LAB — APTT: aPTT: 75 seconds — ABNORMAL HIGH (ref 24–36)

## 2018-01-19 LAB — HEPARIN LEVEL (UNFRACTIONATED): Heparin Unfractionated: 0.53 IU/mL (ref 0.30–0.70)

## 2018-01-19 MED ORDER — POTASSIUM CHLORIDE CRYS ER 20 MEQ PO TBCR
40.0000 meq | EXTENDED_RELEASE_TABLET | Freq: Two times a day (BID) | ORAL | Status: AC
  Administered 2018-01-19 (×2): 40 meq via ORAL
  Filled 2018-01-19 (×2): qty 2

## 2018-01-19 NOTE — Progress Notes (Signed)
Pt came to unit in wheelchair accompanied by  Nurse, placed in bed and made comfortable.

## 2018-01-19 NOTE — Progress Notes (Signed)
PROGRESS NOTE    Colleen Olsen  FHQ:[id number] DOB: 07/05/1970 DOA: 01/17/2018 PCP: Dema Severin, NP     Brief Narrative:  Colleen Olsen is a-year-old female with history of factor V Leiden, prothrombin gene mutation, history of DVT, PE, multiple strokes who presented with hemoptysis in setting of bronchitis.  She was seen in the ICU, and Xarelto was held.  Patient had no further episodes of hemoptysis, transfer to [facility] 1/[facility].  She has been started on IV heparin.  New events last 24 hours / Subjective: No acute events.  Still coughing but no further hemoptysis.  No chest pain.  Assessment & Plan:   Active Problems:   Hemoptysis   Hemoptysis Appreciate PCCM.  Continue to monitor.  If recurs, consider bronchoscopy.  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  Xarelto currently held.  IV heparin started.  Hypokalemia Replace, trend  Mood disorder Continue Effexor, Seroquel, Remeron, Atarax, BuSpar    DVT prophylaxis: Heparin Code Status: Full code Family Communication: At bedside Disposition Plan: Pending further monitoring of hemoptysis, if no further events, plan to transition to Xarelto 1/6 and discharge   Consultants:   PCCM admit  Procedures:   None  Antimicrobials:  Anti-infectives (From admission, onward)   Start     Dose/Rate Route Frequency Ordered Stop   01/19/18 1300  cefTRIAXone (ROCEPHIN) 2 g in sodium chloride 0.9 % 100 mL IVPB     2 g 200 mL/hr over 30 Minutes Intravenous Every 24 hours 01/18/18 1315 01/22/18 1259   01/17/18 1300  cefTRIAXone (ROCEPHIN) 1 g in sodium chloride 0.9 % 100 mL IVPB  Status:  Discontinued     1 g 200 mL/hr over 30 Minutes Intravenous Every 24 hours 01/17/18 1240 01/18/18 1315         Objective: Vitals:   01/19/18 0743 01/19/18 0800 01/19/18 0810 01/19/18 1205  BP: 113/85     Pulse: 100 100    Resp: 15 14    Temp:   98.2 F (36.8 C) 98.2 F (36.8 C)  TempSrc:   Oral Oral  SpO2: 93% 94%    Weight:      Height:        Intake/Output Summary (Last 24 hours) at 01/19/2018 1345 Last data filed at 01/19/2018 1248 Gross per 24 hour  Intake 719.01 ml  Output 2050 ml  Net -1330.99 ml   Filed Weights   01/17/18 1400 01/18/18 0500 01/19/18 0445  Weight: 92.8 kg 94.4 kg 93.2 kg    Examination:  General exam: Appears calm and comfortable  Respiratory system: Crackles right lower base. Respiratory effort normal.  Remains on room air, no conversational dyspnea Cardiovascular system: S1 & S2 heard, RRR. No JVD, murmurs, rubs, gallops or clicks. No pedal edema. Gastrointestinal system: Abdomen is nondistended, soft and nontender. No organomegaly or masses felt. Normal bowel sounds heard. Central nervous system: Alert and oriented. No focal neurological deficits. Extremities: Symmetric 5 x 5 power. Skin: No rashes, lesions or ulcers Psychiatry: Judgement and insight appear normal. Mood & affect appropriate.   Data Reviewed: I have personally reviewed following labs and imaging studies  CBC: Recent Labs  Lab 01/17/18 0311 01/18/18 0236 01/19/18 0415  WBC 8.0 7.8 9.9  NEUTROABS 5.3  --   --   HGB 11.1* 11.5* 10.6*  HCT 36.0  36.9 33.7*  MCV 87.8 86.6 86.0  PLT 374 314 305   Basic Metabolic Panel: Recent Labs  Lab 01/17/18 0311 01/18/18 0236 01/19/18 0415  NA 138 138 138  K 3.8 3.4* 3.1*  CL 104 105 103  CO2 26 23 26   GLUCOSE 95 86 90  BUN 14 8 <5*  CREATININE 0.90 0.84 0.70  CALCIUM 8.3* 8.2* 8.2*  MG 1.6* 1.9  --   PHOS 3.7  --   --    GFR: Estimated Creatinine Clearance: 102.9 mL/min (by C-G formula based on SCr of 0.7 mg/dL). Liver Function Tests: No results for input(s): AST, ALT, ALKPHOS, BILITOT, PROT, ALBUMIN in the last 168  hours. No results for input(s): LIPASE, AMYLASE in the last 168 hours. No results for input(s): AMMONIA in the last 168 hours. Coagulation Profile: No results for input(s): INR, PROTIME in the last 168 hours. Cardiac Enzymes: No results for input(s): CKTOTAL, CKMB, CKMBINDEX, TROPONINI in the last 168 hours. BNP (last 3 results) No results for input(s): PROBNP in the last 8760 hours. HbA1C: No results for input(s): HGBA1C in the last 72 hours. CBG: Recent Labs  Lab 01/17/18 0548  GLUCAP 76   Lipid Profile: No results for input(s): CHOL, HDL, LDLCALC, TRIG, CHOLHDL, LDLDIRECT in the last 72 hours. Thyroid Function Tests: No results for input(s): TSH, T4TOTAL, FREET4, T3FREE, THYROIDAB in the last 72 hours. Anemia Panel: No results for input(s): VITAMINB12, FOLATE, FERRITIN, TIBC, IRON, RETICCTPCT in the last 72 hours. Sepsis Labs: No results for input(s): PROCALCITON, LATICACIDVEN in the last 168 hours.  Recent Results (from the past 240 hour(s))  MRSA PCR Screening     Status: None   Collection Time: 01/17/18  5:47 AM  Result Value Ref Range Status   MRSA by PCR NEGATIVE NEGATIVE Final    Comment:        The GeneXpert MRSA Assay (FDA approved for NASAL specimens only), is one component of a comprehensive MRSA colonization surveillance program. It is not intended to diagnose MRSA infection nor to guide or monitor treatment for MRSA infections. Performed at [facility], [address], [place], [place] [address]        Radiology Studies: Ct Angio Chest Pe W Or Wo Contrast  Result Date: 01/17/2018 CLINICAL DATA:  Evaluate for pulmonary embolism. Hemoptysis. Previous pulmonary embolus in 04/2017 EXAM: CT ANGIOGRAPHY CHEST WITH CONTRAST TECHNIQUE: Multidetector CT imaging of the chest was performed using the standard protocol during bolus administration of intravenous contrast. Multiplanar CT image reconstructions and MIPs were obtained to evaluate the vascular  anatomy. CONTRAST:  ISOVUE-370 IOPAMIDOL (ISOVUE-370) INJECTION 76% COMPARISON:  03/07/2017 FINDINGS: Cardiovascular: Normal heart size. No pericardial effusion identified. The main pulmonary artery is patent. There is no saddle embolus. No central obstructing pulmonary emboli identified bilaterally. No lobar or segmental pulmonary artery filling defects identified. Mediastinum/Nodes: No enlarged mediastinal, hilar, or axillary lymph nodes. Thyroid gland, trachea, and esophagus demonstrate no significant findings. Lungs/Pleura: Large area of dense airspace consolidation within the posterior right lower lobe is identified. There are patchy areas of ground-glass attenuation scattered throughout the remaining portions of the lungs. Calcified granuloma identified within the right upper lobe. Upper Abdomen: Previous cholecystectomy.  No acute abnormality. Musculoskeletal: No chest wall abnormality. No acute or significant osseous findings. Review of the MIP images confirms the above findings. IMPRESSION: 1. No evidence for acute pulmonary embolus. 2. Large area of airspace consolidation within the right lower lobe is identified, new from 11/24/2017 and worrisome  for pneumonia. Scattered areas of ground-glass attenuation throughout the remaining portions of the lungs are likely infectious/inflammatory in etiology. Electronically Signed   By: Signa Kellaylor  Stroud M.D.   On: 01/17/2018 16:46      Scheduled Meds: . busPIRone  10 mg Oral QHS  . chlorhexidine  15 mL Mouth Rinse BID  . mouth rinse  15 mL Mouth Rinse q12n4p  . mirtazapine  30 mg Oral QHS  . oxymetazoline  1 spray Each Nare BID  . polyethylene glycol  17 g Oral Daily  . potassium chloride  40 mEq Oral BID  . QUEtiapine  450 mg Oral QHS  . venlafaxine XR  225 mg Oral QHS   Continuous Infusions: . cefTRIAXone (ROCEPHIN)  IV 2 g (01/19/18 1248)  . heparin 1,300 Units/hr (01/19/18 0900)     LOS: 2 days    Time spent: 40 minutes   Noralee StainJennifer  Azelia Reiger, DO [facility] [email or web address] Password [email or web address] 01/19/2018, 1:45 PM

## 2018-01-20 LAB — BASIC METABOLIC PANEL
ANION GAP: 9 (ref 5–15)
BUN: 5 mg/dL — ABNORMAL LOW (ref 6–20)
CHLORIDE: 109 mmol/L (ref 98–111)
CO2: 25 mmol/L (ref 22–32)
Calcium: 8.4 mg/dL — ABNORMAL LOW (ref 8.9–10.3)
Creatinine, Ser: 0.77 mg/dL (ref 0.44–1.00)
GFR calc Af Amer: >60 mL/min (ref >60)
GFR calc non Af Amer: >60 mL/min (ref >60)
Glucose, Bld: 68 mg/dL — ABNORMAL LOW (ref 70–99)
POTASSIUM: 3.9 mmol/L (ref 3.5–5.1)
Sodium: 143 mmol/L (ref 135–145)

## 2018-01-20 LAB — HEPARIN LEVEL (UNFRACTIONATED): Heparin Unfractionated: 0.52 IU/mL (ref 0.30–0.70)

## 2018-01-20 LAB — CBC
HCT: 32.6 % — ABNORMAL LOW (ref 36.0–46.0)
Hemoglobin: 10 g/dL — ABNORMAL LOW (ref 12.0–15.0)
MCH: 27 pg (ref 26.0–34.0)
MCHC: 30.7 g/dL (ref 30.0–36.0)
MCV: 88.1 fL (ref 80.0–100.0)
NRBC: 0 % (ref 0.0–0.2)
Platelets: 331 10*3/uL (ref 150–400)
RBC: 3.7 MIL/uL — ABNORMAL LOW (ref 3.87–5.11)
RDW: 18 % — ABNORMAL HIGH (ref 11.5–15.5)
WBC: 8.6 10*3/uL (ref 4.0–10.5)

## 2018-01-20 LAB — MAGNESIUM: Magnesium: 1.8 mg/dL (ref 1.7–2.4)

## 2018-01-20 LAB — APTT: aPTT: 77 seconds — ABNORMAL HIGH (ref 24–36)

## 2018-01-20 MED ORDER — CEPHALEXIN 500 MG PO CAPS: 500.0000 mg | ORAL_CAPSULE | Freq: Four times a day (QID) | ORAL | 0 refills | Status: DC

## 2018-01-20 MED ORDER — RIVAROXABAN 20 MG PO TABS
20.0000 mg | ORAL_TABLET | Freq: Every day | ORAL | Status: DC
  Administered 2018-01-20: 20 mg via ORAL
  Filled 2018-01-20 (×2): qty 1

## 2018-01-20 MED ORDER — CEFDINIR 300 MG PO CAPS: 300.0000 mg | ORAL_CAPSULE | Freq: Two times a day (BID) | ORAL | 0 refills | Status: AC

## 2018-01-20 NOTE — Progress Notes (Signed)
Pt provided with d/c instructions.  Pt verbalized understanding. All belongs packed an sent with at discharge. CMT aware PIV removed VSS

## 2018-01-20 NOTE — Plan of Care (Signed)
Plan is discharge today on xarelto

## 2018-01-20 NOTE — Care Management Note (Signed)
Case Management Note  Patient Details  Name: Colleen Olsen MRN: [id number] Date of Birth: [birth year]  Subjective/Objective:    Pt admitted with hemoptysis                Action/Plan:   PTA Independent from home.  Pt informed CM that she has both active medication/prescription coverage and current relationship with PCP.   Prescription coverage verified with pharmacy of choice.   NO CM needs determined at the time of discharge.       Expected Discharge Date:  01/20/18               Expected Discharge Plan:  Home/Self Care  In-House Referral:     Discharge planning Services  CM Consult  Post Acute Care Choice:    Choice offered to:     DME Arranged:    DME Agency:     HH Arranged:    HH Agency:     Status of Service:  Completed, signed off  If discussed at [employer] of Stay Meetings, dates discussed:    Additional Comments:  Cherylann Parr, RN 01/20/2018, 11:15 AM

## 2018-01-20 NOTE — Discharge Summary (Signed)
Physician Discharge Summary  Colleen Olsen SWN:[id number] DOB: Apr 05, 1970 DOA: 01/17/2018  PCP: Dema Severin, NP  Admit date: 01/17/2018 Discharge date: 01/20/2018  Admitted From: Home Disposition:  Home  Recommendations for Outpatient Follow-up:  1. Follow up with PCP in 1 week  Discharge Condition: Stable CODE STATUS: Full  Diet recommendation: Regular   Brief/Interim Summary: Colleen Olsen is a-year-old female with history of factor V Leiden, prothrombin gene mutation, history of DVT, PE, multiple strokes who presented with hemoptysis in setting of bronchitis.  She was seen in the ICU, and Xarelto was held.  Patient had no further episodes of hemoptysis, transfer to [facility] 1/5.  She has been started on IV heparin and has remained stable without any further episodes of hemoptysis. She was transitioned to Xarelto prior to discharge.   Hemoptysis Appreciate PCCM. No further episodes, stable. 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 Resume Xarelto   Mood disorder Continue Effexor, Seroquel, Remeron, Atarax, BuSpar   Discharge Instructions  Discharge Instructions    Call MD for:   Complete by:  As directed    Recurrent blood in sputum, worsening cough   Call MD for:  difficulty breathing, headache or visual disturbances   Complete by:  As directed    Call MD for:  extreme fatigue   Complete by:  As directed    Call MD for:  persistant dizziness or light-headedness   Complete by:  As directed    Call MD for:  persistant nausea and vomiting   Complete by:  As directed    Call MD for:  severe uncontrolled pain   Complete by:  As directed    Call MD for:  temperature >100.4   Complete by:  As directed    Diet general   Complete by:  As directed    Discharge instructions   Complete by:  As directed    You were cared for by a  hospitalist during your hospital stay. If you have any questions about your discharge medications or the care you received while you were in the hospital after you are discharged, you can call the unit and ask to speak with the hospitalist on call if the hospitalist that took care of you is not available. Once you are discharged, your primary care physician will handle any further medical issues. Please note that NO REFILLS for any discharge medications will be authorized once you are discharged, as it is imperative that you return to your primary care physician (or establish a relationship with a primary care physician if you do not have one) for your aftercare needs so that they can reassess your need for medications and monitor your lab values.   Increase activity slowly   Complete by:  As directed      Allergies as of 01/20/2018      Reactions   Doxycycline    confusion   Latex    Hives, burning   Paxil [paroxetine Hcl]    Hives, itching   Prozac [fluoxetine Hcl]    Hives, itching   Sulfa Antibiotics    Hives      Medication List    STOP taking these medications   levofloxacin 750 MG tablet Commonly known as:  LEVAQUIN   nystatin 100000 UNIT/ML suspension Commonly known as:  MYCOSTATIN     TAKE these medications   aspirin EC 81  MG tablet Take 81 mg by mouth daily.   busPIRone 10 MG tablet Commonly known as:  BUSPAR Take 50 mg by mouth daily.   cephALEXin 500 MG capsule Commonly known as:  KEFLEX Take 1 capsule (500 mg total) by mouth 4 (four) times daily for 2 days.   eszopiclone 2 MG Tabs tablet Commonly known as:  LUNESTA Take 6 mg by mouth at bedtime as needed for sleep. Take immediately before bedtime   gabapentin 600 MG tablet Commonly known as:  NEURONTIN Take 600 mg by mouth 4 (four) times daily as needed (pain).   hydrochlorothiazide 25 MG tablet Commonly known as:  HYDRODIURIL Take 25 mg by mouth daily as needed (anxiety).   mirtazapine 45 MG  tablet Commonly known as:  REMERON Take 45 mg by mouth at bedtime.   multivitamin with minerals Tabs tablet Take 1 tablet by mouth daily.   polyethylene glycol packet Commonly known as:  MIRALAX / GLYCOLAX Take 17 g by mouth daily as needed for moderate constipation.   QUEtiapine 100 MG tablet Commonly known as:  SEROQUEL Take 450 mg by mouth at bedtime.   rivaroxaban 20 MG Tabs tablet Commonly known as:  XARELTO Take 20 mg by mouth daily with supper.   venlafaxine XR 150 MG 24 hr capsule Commonly known as:  EFFEXOR-XR Take 150 mg by mouth daily with breakfast.       Allergies  Allergen Reactions  . Doxycycline     confusion  . Latex     Hives, burning  . Paxil [Paroxetine Hcl]     Hives, itching  . Prozac [Fluoxetine Hcl]     Hives, itching  . Sulfa Antibiotics     Hives     Consultations:  PCCM admit    Procedures/Studies: Ct Angio Chest Pe W Or Wo Contrast  Result Date: 01/17/2018 CLINICAL DATA:  Evaluate for pulmonary embolism. Hemoptysis. Previous pulmonary embolus in 04/2017 EXAM: CT ANGIOGRAPHY CHEST WITH CONTRAST TECHNIQUE: Multidetector CT imaging of the chest was performed using the standard protocol during bolus administration of intravenous contrast. Multiplanar CT image reconstructions and MIPs were obtained to evaluate the vascular anatomy. CONTRAST:  100mL ISOVUE-370 IOPAMIDOL (ISOVUE-370) INJECTION 76% COMPARISON:  03/07/2017 FINDINGS: Cardiovascular: Normal heart size. No pericardial effusion identified. The main pulmonary artery is patent. There is no saddle embolus. No central obstructing pulmonary emboli identified bilaterally. No lobar or segmental pulmonary artery filling defects identified. Mediastinum/Nodes: No enlarged mediastinal, hilar, or axillary lymph nodes. Thyroid gland, trachea, and esophagus demonstrate no significant findings. Lungs/Pleura: Large area of dense airspace consolidation within the posterior right lower lobe is identified.  There are patchy areas of ground-glass attenuation scattered throughout the remaining portions of the lungs. Calcified granuloma identified within the right upper lobe. Upper Abdomen: Previous cholecystectomy.  No acute abnormality. Musculoskeletal: No chest wall abnormality. No acute or significant osseous findings. Review of the MIP images confirms the above findings. IMPRESSION: 1. No evidence for acute pulmonary embolus. 2. Large area of airspace consolidation within the right lower lobe is identified, new from 11/24/2017 and worrisome for pneumonia. Scattered areas of ground-glass attenuation throughout the remaining portions of the lungs are likely infectious/inflammatory in etiology. Electronically Signed   By: Signa Kellaylor  Stroud M.D.   On: 01/17/2018 16:46   Dg Chest Port 1 View  Result Date: 01/17/2018 CLINICAL DATA:  Acute onset of hemoptysis. Patient on Xarelto. EXAM: PORTABLE CHEST 1 VIEW COMPARISON:  Chest radiograph performed earlier today at 12:07 a.m. FINDINGS: The lungs are  well-aerated. Persistent right basilar airspace opacity is suspicious for pneumonia. As before, aspiration cannot be excluded. There is no evidence of pleural effusion or pneumothorax. The cardiomediastinal silhouette is normal in size. Postoperative change is noted overlying the mediastinum. No acute osseous abnormalities are seen. IMPRESSION: Persistent right basilar airspace opacity is suspicious for pneumonia, stable from the recent prior study. As before, aspiration cannot be excluded. Electronically Signed   By: Roanna RaiderJeffery  Chang M.D.   On: 01/17/2018 03:59      Discharge Exam: Vitals:   01/20/18 0400 01/20/18 0751  BP: 100/63   Pulse:  (!) 108  Resp: 13 (!) 21  Temp: 97.7 F (36.5 C) 98.9 F (37.2 C)  SpO2:  94%    General: Pt is alert, awake, not in acute distress Cardiovascular: RRR, S1/S2 +, no rubs, no gallops Respiratory: Diminished breath sounds, no wheezing, no rhonchi, off O2, no conversational  dyspnea  Abdominal: Soft, NT, ND, bowel sounds + Extremities: no edema, no cyanosis    The results of significant diagnostics from this hospitalization (including imaging, microbiology, ancillary and laboratory) are listed below for reference.     Microbiology: Recent Results (from the past 240 hour(s))  MRSA PCR Screening     Status: None   Collection Time: 01/17/18  5:47 AM  Result Value Ref Range Status   MRSA by PCR NEGATIVE NEGATIVE Final    Comment:        The GeneXpert MRSA Assay (FDA approved for NASAL specimens only), is one component of a comprehensive MRSA colonization surveillance program. It is not intended to diagnose MRSA infection nor to guide or monitor treatment for MRSA infections. Performed at [facility], [address] [place], [place] [address]      Labs: BNP (last 3 results) No results for input(s): BNP in the last 8760 hours. Basic Metabolic Panel: Recent Labs  Lab 01/17/18 0311 01/18/18 0236 01/19/18 0415 01/20/18 0214  NA 138 138 138 143  K 3.8 3.4* 3.1* 3.9  CL 104 105 103 109  CO2 26 23 26  PENDING  GLUCOSE 95 86 90 68*  BUN 14 8 <5* 5*  CREATININE 0.90 0.84 0.70 0.77  CALCIUM 8.3* 8.2* 8.2* 8.4*  MG 1.6* 1.9  --  1.8  PHOS 3.7  --   --   --    Liver Function Tests: No results for input(s): AST, ALT, ALKPHOS, BILITOT, PROT, ALBUMIN in the last 168 hours. No results for input(s): LIPASE, AMYLASE in the last 168 hours. No results for input(s): AMMONIA in the last 168 hours. CBC: Recent Labs  Lab 01/17/18 0311 01/18/18 0236 01/19/18 0415 01/20/18 0214  WBC 8.0 7.8 9.9 8.6  NEUTROABS 5.3  --   --   --   HGB 11.1* 11.5* 10.6* 10.0*  HCT 36.0 36.9 33.7* 32.6*  MCV 87.8 86.6 86.0 88.1  PLT 374 314 305 331   Cardiac Enzymes: No results for input(s): CKTOTAL, CKMB, CKMBINDEX, TROPONINI in the last 168 hours. BNP: Invalid input(s): POCBNP CBG: Recent Labs  Lab 01/17/18 0548  GLUCAP 76   D-Dimer No results for  input(s): DDIMER in the last 72 hours. Hgb A1c No results for input(s): HGBA1C in the last 72 hours. Lipid Profile No results for input(s): CHOL, HDL, LDLCALC, TRIG, CHOLHDL, LDLDIRECT in the last 72 hours. Thyroid function studies No results for input(s): TSH, T4TOTAL, T3FREE, THYROIDAB in the last 72 hours.  Invalid input(s): FREET3 Anemia work up No results for input(s): VITAMINB12, FOLATE, FERRITIN,  TIBC, IRON, RETICCTPCT in the last 72 hours. Urinalysis No results found for: COLORURINE, APPEARANCEUR, LABSPEC, PHURINE, GLUCOSEU, HGBUR, BILIRUBINUR, KETONESUR, PROTEINUR, UROBILINOGEN, NITRITE, LEUKOCYTESUR Sepsis Labs Invalid input(s): PROCALCITONIN,  WBC,  LACTICIDVEN Microbiology Recent Results (from the past 240 hour(s))  MRSA PCR Screening     Status: None   Collection Time: 01/17/18  5:47 AM  Result Value Ref Range Status   MRSA by PCR NEGATIVE NEGATIVE Final    Comment:        The GeneXpert MRSA Assay (FDA approved for NASAL specimens only), is one component of a comprehensive MRSA colonization surveillance program. It is not intended to diagnose MRSA infection nor to guide or monitor treatment for MRSA infections. Performed at [facility], [address], [place], [place] [address]      Patient was seen and examined on the day of discharge and was found to be in stable condition. Time coordinating discharge: 25 minutes including assessment and coordination of care, as well as examination of the patient.   SIGNED:  Noralee Stain, DO [facility] Pager [phone]  If 7PM-7AM, please contact night-coverage [email or web address] Password [email or web address] 01/20/2018, 10:19 AM

## 2018-01-20 NOTE — Progress Notes (Signed)
ANTICOAGULATION CONSULT NOTE   Pharmacy Consult for Heparin Indication: pulmonary embolus  Patient Measurements: Heparin Dosing Weight: 82 kg  Vital Signs: Temp: 98.9 F (37.2 C) (01/06 0751) Temp Source: Oral (01/06 0751) BP: 100/63 (01/06 0400) Pulse Rate: 108 (01/06 0751)  Labs: Recent Labs    01/18/18 0236 01/19/18 0415 01/20/18 0214  HGB 11.5* 10.6* 10.0*  HCT 36.9 33.7* 32.6*  PLT 314 305 331  APTT 80* 75* 77*  HEPARINUNFRC 0.76* 0.53 0.52  CREATININE 0.84 0.70 0.77    Medical History: Past Medical History:  Diagnosis Date  . Anemia   . Arthritis   . Depression   . Headache(784.0)   . Shortness of breath     Assessment: Admitted with hemoptysis > now resolved. 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 She was taking Xarelto for a previous PE diagnosed in April 2019. H/h, plts wnl. Baseline aPTT + HL have been drawn. Starting a heparin infusion until CTA rules out an acute PE.   Heparin drip 1300 uts/hr HL 0.5 at [facility] > plan change back to rivaroxaban   - no new clots   Goal of Therapy:  APTT 66-102 secs Heparin level 0.3-0.7 units/ml Monitor platelets by anticoagulation protocol: Yes    Plan:  rivaroxaban 20mg  daily  Start now and turn off heparin  Leota Sauers Pharm.D. CPP, BCPS Clinical Pharmacist [phone] 01/20/2018 11:50 AM

## 2018-02-05 ENCOUNTER — Inpatient Hospital Stay (HOSPITAL_COMMUNITY)
Admission: EM | Admit: 2018-02-05 | Discharge: 2018-02-07 | DRG: 178 | Disposition: A | Discharge status: Home / Self Care | Payer: Medicaid Other | Attending: Internal Medicine | Admitting: Internal Medicine

## 2018-02-05 ENCOUNTER — Emergency Department (HOSPITAL_COMMUNITY): Payer: Medicaid Other

## 2018-02-05 ENCOUNTER — Other Ambulatory Visit: Payer: Self-pay

## 2018-02-05 ENCOUNTER — Encounter (HOSPITAL_COMMUNITY): Payer: Self-pay | Admitting: Emergency Medicine

## 2018-02-05 DIAGNOSIS — Z9049 Acquired absence of other specified parts of digestive tract: Secondary | ICD-10-CM

## 2018-02-05 DIAGNOSIS — Z90710 Acquired absence of both cervix and uterus: Secondary | ICD-10-CM

## 2018-02-05 DIAGNOSIS — J690 Pneumonitis due to inhalation of food and vomit: Principal | ICD-10-CM | POA: Diagnosis present

## 2018-02-05 DIAGNOSIS — F322 Major depressive disorder, single episode, severe without psychotic features: Secondary | ICD-10-CM | POA: Diagnosis present

## 2018-02-05 DIAGNOSIS — K219 Gastro-esophageal reflux disease without esophagitis: Secondary | ICD-10-CM

## 2018-02-05 DIAGNOSIS — Z7982 Long term (current) use of aspirin: Secondary | ICD-10-CM

## 2018-02-05 DIAGNOSIS — Z8673 Personal history of transient ischemic attack (TIA), and cerebral infarction without residual deficits: Secondary | ICD-10-CM

## 2018-02-05 DIAGNOSIS — Z9884 Bariatric surgery status: Secondary | ICD-10-CM

## 2018-02-05 DIAGNOSIS — Z86711 Personal history of pulmonary embolism: Secondary | ICD-10-CM

## 2018-02-05 DIAGNOSIS — J189 Pneumonia, unspecified organism: Secondary | ICD-10-CM

## 2018-02-05 DIAGNOSIS — D6851 Activated protein C resistance: Secondary | ICD-10-CM

## 2018-02-05 DIAGNOSIS — J181 Lobar pneumonia, unspecified organism: Secondary | ICD-10-CM | POA: Diagnosis present

## 2018-02-05 DIAGNOSIS — Z79899 Other long term (current) drug therapy: Secondary | ICD-10-CM

## 2018-02-05 DIAGNOSIS — Z7901 Long term (current) use of anticoagulants: Secondary | ICD-10-CM

## 2018-02-05 DIAGNOSIS — E876 Hypokalemia: Secondary | ICD-10-CM

## 2018-02-05 DIAGNOSIS — M545 Low back pain: Secondary | ICD-10-CM | POA: Diagnosis present

## 2018-02-05 HISTORY — DX: Atrial septal defect: Q21.1

## 2018-02-05 HISTORY — DX: Patent foramen ovale: Q21.12

## 2018-02-05 HISTORY — DX: Activated protein C resistance: D68.51

## 2018-02-05 HISTORY — DX: Other pulmonary embolism without acute cor pulmonale: I26.99

## 2018-02-05 HISTORY — DX: Hereditary deficiency of other clotting factors: D68.2

## 2018-02-05 LAB — CBC WITH DIFFERENTIAL/PLATELET
Abs Immature Granulocytes: 0.04 10*3/uL (ref 0.00–0.07)
Basophils Absolute: 0.1 10*3/uL (ref 0.0–0.1)
Basophils Relative: 1 %
Eosinophils Absolute: 0.2 10*3/uL (ref 0.0–0.5)
Eosinophils Relative: 2 %
HCT: 35.3 % — ABNORMAL LOW (ref 36.0–46.0)
HEMOGLOBIN: 10.6 g/dL — AB (ref 12.0–15.0)
Immature Granulocytes: 0 %
Lymphocytes Relative: 15 %
Lymphs Abs: 1.4 10*3/uL (ref 0.7–4.0)
MCH: 26.8 pg (ref 26.0–34.0)
MCHC: 30 g/dL (ref 30.0–36.0)
MCV: 89.4 fL (ref 80.0–100.0)
Monocytes Absolute: 0.5 10*3/uL (ref 0.1–1.0)
Monocytes Relative: 6 %
Neutro Abs: 7.2 10*3/uL (ref 1.7–7.7)
Neutrophils Relative %: 76 %
Platelets: 352 10*3/uL (ref 150–400)
RBC: 3.95 MIL/uL (ref 3.87–5.11)
RDW: 15.9 % — ABNORMAL HIGH (ref 11.5–15.5)
WBC: 9.4 10*3/uL (ref 4.0–10.5)
nRBC: 0 % (ref 0.0–0.2)

## 2018-02-05 LAB — LACTIC ACID, PLASMA: Lactic Acid, Venous: 1.6 mmol/L (ref 0.5–1.9)

## 2018-02-05 LAB — COMPREHENSIVE METABOLIC PANEL
ALT: 13 U/L (ref 0–44)
AST: 17 U/L (ref 15–41)
Albumin: 2.7 g/dL — ABNORMAL LOW (ref 3.5–5.0)
Alkaline Phosphatase: 70 U/L (ref 38–126)
Anion gap: 12 (ref 5–15)
BUN: 13 mg/dL (ref 6–20)
CHLORIDE: 99 mmol/L (ref 98–111)
CO2: 25 mmol/L (ref 22–32)
Calcium: 8.4 mg/dL — ABNORMAL LOW (ref 8.9–10.3)
Creatinine, Ser: 0.68 mg/dL (ref 0.44–1.00)
GFR calc Af Amer: >60 mL/min (ref >60)
Glucose, Bld: 97 mg/dL (ref 70–99)
Potassium: 3.2 mmol/L — ABNORMAL LOW (ref 3.5–5.1)
SODIUM: 136 mmol/L (ref 135–145)
Total Bilirubin: 0.4 mg/dL (ref 0.3–1.2)
Total Protein: 6 g/dL — ABNORMAL LOW (ref 6.5–8.1)

## 2018-02-05 LAB — INFLUENZA PANEL BY PCR (TYPE A & B)
Influenza A By PCR: NEGATIVE
Influenza B By PCR: NEGATIVE

## 2018-02-05 MED ORDER — ACETAMINOPHEN 325 MG PO TABS
650.0000 mg | ORAL_TABLET | Freq: Once | ORAL | Status: AC
  Administered 2018-02-05: 650 mg via ORAL
  Filled 2018-02-05: qty 2

## 2018-02-05 MED ORDER — ASPIRIN EC 81 MG PO TBEC
81.0000 mg | DELAYED_RELEASE_TABLET | Freq: Every day | ORAL | Status: DC
  Administered 2018-02-06 – 2018-02-07 (×2): 81 mg via ORAL
  Filled 2018-02-05 (×2): qty 1

## 2018-02-05 MED ORDER — LEVOFLOXACIN IN D5W 750 MG/150ML IV SOLN
750.0000 mg | Freq: Once | INTRAVENOUS | Status: AC
  Administered 2018-02-05: 750 mg via INTRAVENOUS
  Filled 2018-02-05: qty 150

## 2018-02-05 MED ORDER — VANCOMYCIN HCL 10 G IV SOLR
2000.0000 mg | Freq: Once | INTRAVENOUS | Status: AC
  Administered 2018-02-05: 2000 mg via INTRAVENOUS
  Filled 2018-02-05: qty 2000

## 2018-02-05 MED ORDER — VENLAFAXINE HCL ER 150 MG PO CP24
150.0000 mg | ORAL_CAPSULE | Freq: Every day | ORAL | Status: DC
  Administered 2018-02-06 – 2018-02-07 (×2): 150 mg via ORAL
  Filled 2018-02-05: qty 1
  Filled 2018-02-05 (×2): qty 2
  Filled 2018-02-05: qty 1

## 2018-02-05 MED ORDER — BUSPIRONE HCL 5 MG PO TABS
50.0000 mg | ORAL_TABLET | Freq: Every day | ORAL | Status: DC
  Administered 2018-02-05: 50 mg via ORAL
  Filled 2018-02-05: qty 5

## 2018-02-05 MED ORDER — BUSPIRONE HCL 5 MG PO TABS
10.0000 mg | ORAL_TABLET | Freq: Three times a day (TID) | ORAL | Status: DC
  Administered 2018-02-06 – 2018-02-07 (×4): 10 mg via ORAL
  Filled 2018-02-05 (×4): qty 2

## 2018-02-05 MED ORDER — SODIUM CHLORIDE 0.9 % IV BOLUS
1000.0000 mL | Freq: Once | INTRAVENOUS | Status: AC
  Administered 2018-02-05: 1000 mL via INTRAVENOUS

## 2018-02-05 MED ORDER — GABAPENTIN 600 MG PO TABS
600.0000 mg | ORAL_TABLET | Freq: Four times a day (QID) | ORAL | Status: DC | PRN
  Filled 2018-02-05: qty 1

## 2018-02-05 MED ORDER — ACETAMINOPHEN 325 MG PO TABS
650.0000 mg | ORAL_TABLET | Freq: Four times a day (QID) | ORAL | Status: DC | PRN
  Administered 2018-02-06 – 2018-02-07 (×3): 650 mg via ORAL
  Filled 2018-02-05 (×4): qty 2

## 2018-02-05 MED ORDER — SODIUM CHLORIDE 0.9% FLUSH: 3.0000 mL | Freq: Once | INTRAVENOUS | Status: DC

## 2018-02-05 MED ORDER — QUETIAPINE FUMARATE 400 MG PO TABS
400.0000 mg | ORAL_TABLET | Freq: Every day | ORAL | Status: DC
  Administered 2018-02-05: 400 mg via ORAL
  Filled 2018-02-05: qty 1

## 2018-02-05 MED ORDER — POLYETHYLENE GLYCOL 3350 17 G PO PACK: 17.0000 g | PACK | Freq: Every day | ORAL | Status: DC | PRN

## 2018-02-05 MED ORDER — RIVAROXABAN 20 MG PO TABS
20.0000 mg | ORAL_TABLET | Freq: Every day | ORAL | Status: DC
  Administered 2018-02-06: 20 mg via ORAL
  Filled 2018-02-05 (×2): qty 1

## 2018-02-05 MED ORDER — QUETIAPINE FUMARATE 50 MG PO TABS
50.0000 mg | ORAL_TABLET | Freq: Every day | ORAL | Status: DC
  Administered 2018-02-05: 50 mg via ORAL
  Filled 2018-02-05: qty 1

## 2018-02-05 MED ORDER — LEVOFLOXACIN IN D5W 750 MG/150ML IV SOLN: 750.0000 mg | INTRAVENOUS | Status: DC

## 2018-02-05 MED ORDER — SODIUM CHLORIDE 0.9% FLUSH: 3.0000 mL | Freq: Two times a day (BID) | INTRAVENOUS | Status: DC

## 2018-02-05 MED ORDER — ACETAMINOPHEN 650 MG RE SUPP: 650.0000 mg | Freq: Four times a day (QID) | RECTAL | Status: DC | PRN

## 2018-02-05 MED ORDER — VANCOMYCIN HCL 10 G IV SOLR
1500.0000 mg | Freq: Two times a day (BID) | INTRAVENOUS | Status: DC
  Administered 2018-02-06: 1500 mg via INTRAVENOUS
  Filled 2018-02-05: qty 1500

## 2018-02-05 MED ORDER — POTASSIUM CHLORIDE CRYS ER 20 MEQ PO TBCR
40.0000 meq | EXTENDED_RELEASE_TABLET | Freq: Once | ORAL | Status: AC
  Administered 2018-02-05: 40 meq via ORAL
  Filled 2018-02-05: qty 2

## 2018-02-05 MED ORDER — HYDROCHLOROTHIAZIDE 25 MG PO TABS: 25.0000 mg | ORAL_TABLET | Freq: Every day | ORAL | Status: DC | PRN

## 2018-02-05 NOTE — ED Notes (Signed)
Patient transported to X-ray 

## 2018-02-05 NOTE — ED Triage Notes (Signed)
Pt sent from primary care provider for recurrent pneumonia. Pt husband reports fevers at home, pt states she went to the doctor today and they did a chest xray and it showed pneumonia. Pt was hospitalized recently here for pneumonia and finished 5 days of abx and 2 days of oral abx (9 last taken Saturday)

## 2018-02-05 NOTE — ED Provider Notes (Signed)
[facility] Provider [facility]   CSN: [id number] Arrival date & time: 02/05/18  1542     History   Chief Complaint Chief Complaint  Patient presents with  . Pneumonia    HPI Allen KellChristy G Niebuhr is a 48 y.o. female presenting for concern of recurrent pneumonia.  Patient with admission on 01/17/2018 for pneumonia, patient with 3-day hospital stay at that time, discharged on Augmentin x2 days.  Following this patient states that she never felt quite well, endorses persistent nonproductive cough, fever, chills and generalized weakness.  Patient reported to her primary care provider's office today where chest x-ray was performed showing recurrent pneumonia.  Unable to access these images in EMR today.  Endorses fever of 102 F over the past 3 days.  Initial evaluation patient is resting comfortably and in no acute distress.  Pulse in the 90s, SPO2 94% on room air.  Patient denying focal pain, does endorse generalized weakness, fatigue as well as continued cough without hemoptysis.  She denies overt chest pain, extremity swelling/tenderness or additional concerns.  Patient is anticoagulated on Xarelto.  HPI  Past Medical History:  Diagnosis Date  . Anemia   . Arthritis   . Depression   . Headache(784.0)   . Shortness of breath     Patient Active Problem List   Diagnosis Date Noted  . Hemoptysis 01/17/2018  . Anticoagulant long-term use   . HCAP (healthcare-associated pneumonia) 09/05/2012   Past Surgical History:  Procedure Laterality Date  . ABDOMINAL HYSTERECTOMY    . CHOLECYSTECTOMY    . gastri bypass 2003    . TONSILLECTOMY       OB History   No obstetric history on file.      Home Medications    Prior to Admission medications   Medication Sig Start Date End Date Taking? Authorizing Provider  aspirin EC 81 MG tablet Take 81 mg by mouth daily.   Yes [provider]  busPIRone (BUSPAR) 10 MG tablet Take 50 mg by mouth daily.    Yes [provider]  gabapentin (NEURONTIN) 600 MG tablet Take 600 mg by mouth 4 (four) times daily as needed (pain).    Yes [provider]  mirtazapine (REMERON) 45 MG tablet Take 45 mg by mouth at bedtime.   Yes [provider]  Multiple Vitamin (MULTIVITAMIN WITH MINERALS) TABS tablet Take 1 tablet by mouth daily.   Yes [provider]  QUEtiapine (SEROQUEL) 400 MG tablet Take 400 mg by mouth at bedtime.   Yes [provider]  QUEtiapine (SEROQUEL) 50 MG tablet Take 50 mg by mouth at bedtime.   Yes [provider]  rivaroxaban (XARELTO) 20 MG TABS tablet Take 20 mg by mouth daily with supper.   Yes [provider]  venlafaxine XR (EFFEXOR-XR) 150 MG 24 hr capsule Take 150 mg by mouth daily with breakfast.   Yes [provider]  hydrochlorothiazide (HYDRODIURIL) 25 MG tablet Take 25 mg by mouth daily as needed (anxiety).    [provider]  polyethylene glycol (MIRALAX / GLYCOLAX) packet Take 17 g by mouth daily as needed for moderate constipation.    [provider]    Family History Family History  Problem Relation Age of Onset  . Bradycardia Father        Pacemaker  . Hypertension Father   . Hypertension Mother   . Lung cancer Mother        Deceased    Social History  Social History   Tobacco Use  . Smoking status: Never Smoker  . Smokeless tobacco: Never Used  Substance Use Topics  . Alcohol use: No  . Drug use: No     Allergies   Doxycycline; Keflex [cephalexin]; Latex; Paxil [paroxetine hcl]; Prozac [fluoxetine hcl]; and Sulfa antibiotics   Review of Systems Review of Systems  Constitutional: Positive for fatigue and fever. Negative for chills.  Respiratory: Positive for cough. Negative for shortness of breath.   Cardiovascular: Negative.  Negative for chest pain.  Gastrointestinal: Negative.  Negative for abdominal pain, diarrhea, nausea and vomiting.  Musculoskeletal:  Negative.  Negative for arthralgias and myalgias.  Neurological: Positive for weakness (Generalized). Negative for dizziness and headaches.  All other systems reviewed and are negative.  Physical Exam Updated Vital Signs BP 130/85 (BP Location: Right Arm)   Pulse (!) 107   Temp 98.5 F (36.9 C) (Oral)   Resp 16   SpO2 100%   Physical Exam Constitutional:      General: She is not in acute distress.    Appearance: Normal appearance. She is well-developed. She is ill-appearing. She is not toxic-appearing or diaphoretic.  HENT:     Head: Normocephalic and atraumatic.     Right Ear: External ear normal.     Left Ear: External ear normal.     Nose: Nose normal.     Mouth/Throat:     Mouth: Mucous membranes are moist.     Pharynx: Oropharynx is clear.  Eyes:     Extraocular Movements: Extraocular movements intact.     Conjunctiva/sclera: Conjunctivae normal.     Pupils: Pupils are equal, round, and reactive to light.  Neck:     Musculoskeletal: Normal range of motion and neck supple.     Trachea: Trachea normal. No tracheal deviation.  Cardiovascular:     Rate and Rhythm: Normal rate and regular rhythm.     Pulses: Normal pulses.          Dorsalis pedis pulses are 2+ on the right side and 2+ on the left side.       Posterior tibial pulses are 2+ on the right side and 2+ on the left side.     Heart sounds: Normal heart sounds.  Pulmonary:     Effort: Pulmonary effort is normal. No respiratory distress.     Breath sounds: Normal air entry. Decreased breath sounds and rhonchi present.  Chest:     Chest wall: No tenderness.  Abdominal:     Palpations: Abdomen is soft.     Tenderness: There is no abdominal tenderness. There is no guarding or rebound.  Musculoskeletal: Normal range of motion.     Right lower leg: Normal. She exhibits no tenderness. No edema.     Left lower leg: Normal. She exhibits no tenderness. No edema.  Feet:     Right foot:     Protective Sensation: 3 sites  tested. 3 sites sensed.     Left foot:     Protective Sensation: 3 sites tested. 3 sites sensed.  Skin:    General: Skin is warm and dry.     Capillary Refill: Capillary refill takes less than 2 seconds.  Neurological:     Mental Status: She is alert.     GCS: GCS eye subscore is 4. GCS verbal subscore is 5. GCS motor subscore is 6.     Comments: Speech is clear and goal oriented, follows commands Major Cranial nerves without deficit, no facial droop Normal  strength in upper and lower extremities bilaterally including dorsiflexion and plantar flexion, strong and equal grip strength Sensation normal to light touch Moves extremities without ataxia, coordination intact  Psychiatric:        Behavior: Behavior normal.    ED Treatments / Results  Labs (all labs ordered are listed, but only abnormal results are displayed) Labs Reviewed  COMPREHENSIVE METABOLIC PANEL - Abnormal; Notable for the following components:      Result Value   Potassium 3.2 (*)    Calcium 8.4 (*)    Total Protein 6.0 (*)    Albumin 2.7 (*)    All other components within normal limits  CBC WITH DIFFERENTIAL/PLATELET - Abnormal; Notable for the following components:   Hemoglobin 10.6 (*)    HCT 35.3 (*)    RDW 15.9 (*)    All other components within normal limits  LACTIC ACID, PLASMA  URINALYSIS, ROUTINE W REFLEX MICROSCOPIC    EKG None  Radiology Dg Chest 2 View  Result Date: 02/05/2018 CLINICAL DATA:  Recurrent pneumonia. EXAM: CHEST - 2 VIEW COMPARISON:  January 17, 2018 FINDINGS: Multifocal infiltrates are seen in the lungs, significantly worsened since January 17, 2018. The opacities are most prominent in the right lung, possibly involving the right upper lobe on today's study. There also appears to be some mild patchy opacity in the left base which is new. No change in the cardiomediastinal silhouette. No pneumothorax. No other acute abnormalities. IMPRESSION: 1. Multifocal infiltrates, significantly  worsened since January 17, 2018. The worsening despite treatment over 19 days raises the possibility of a poorly treated pneumonia, an atypical infection, and aspiration. Other etiologies are possible. Recommend clinical correlation and follow-up to resolution. If the patient's symptoms continue to worsen and/or persist, recommend pulmonary consultation for further assessment. Electronically Signed   By: Gerome Samavid  Williams III M.D   On: 02/05/2018 17:29   Procedures Procedures (including critical care time)  Medications Ordered in ED Medications  sodium chloride flush (NS) 0.9 % injection 3 mL (0 mLs Intravenous Hold 02/05/18 1643)  sodium chloride 0.9 % bolus 1,000 mL (1,000 mLs Intravenous New Bag/Given 02/05/18 1736)  levofloxacin (LEVAQUIN) IVPB 750 mg (has no administration in time range)   Initial Impression / Assessment and Plan / ED Course  I have reviewed the triage vital signs and the nursing notes.  Pertinent labs & imaging results that were available during my care of the patient were reviewed by me and considered in my medical decision making (see chart for details).    48 year old female presenting for concern of recurrent pneumonia after recent hospital admission.  Lactic acid 1.6 CBC nonacute CMP nonacute Urinalysis pending Chest x-ray:  IMPRESSION:  1. Multifocal infiltrates, significantly worsened since January 17, 2018. The worsening despite treatment over 19 days raises the  possibility of a poorly treated pneumonia, an atypical infection,  and aspiration. Other etiologies are possible. Recommend clinical  correlation and follow-up to resolution. If the patient's symptoms  continue to worsen and/or persist, recommend pulmonary consultation  for further assessment.  --------------------- Patient appears to have failed outpatient treatment of previous pneumonia, previously treated with beta lactams.  We will began community-acquired pneumonia coverage.  Pharmacy  consulted.  Patient resting comfortably and in no acute distress, not tachycardic or hypoxic on reevaluation on room air.  Case discussed with Dr. Judd Lienelo, plan for admission, broad-spectrum antibiotics for patient's multifocal pneumonia at this time.  Consult called for admission. ----------- Patient has been admitted for further treatment  and management of her multifocal pneumonia.   Note: Portions of this report may have been transcribed using voice recognition software. Every effort was made to ensure accuracy; however, inadvertent computerized transcription errors may still be present. Final Clinical Impressions(s) / ED Diagnoses   Final diagnoses:  Multifocal pneumonia    ED Discharge Orders    None       Elizabeth Palau 02/05/18 1854    Geoffery Lyons, MD 02/05/18 2239

## 2018-02-05 NOTE — Progress Notes (Signed)
Pharmacy Antibiotic Note  Colleen Olsen is a 48 y.o. female admitted on 02/05/2018 with pneumonia.  Pharmacy has been consulted for Vancomycin dosing.  Plan: Vanc 2000 mg IV x 1, then 1500 mg IV q12hr (est AUC 500) Will monitor renal function, C&S, and vancomycin levels as appropriate   Weight: 207 lb (93.9 kg)  Temp (24hrs), Avg:98.5 F (36.9 C), Min:98.5 F (36.9 C), Max:98.5 F (36.9 C)  Recent Labs  Lab 02/05/18 1631  WBC 9.4  CREATININE 0.68  LATICACIDVEN 1.6    Estimated Creatinine Clearance: 103.3 mL/min (by C-G formula based on SCr of 0.68 mg/dL).    Allergies  Allergen Reactions  . Doxycycline     confusion  . Keflex [Cephalexin] Hives and Itching  . Latex     Hives, burning  . Paxil [Paroxetine Hcl]     Hives, itching  . Prozac [Fluoxetine Hcl]     Hives, itching  . Sulfa Antibiotics     Hives     Thank you for allowing pharmacy to be a part of this patient's care.  Jeanella Cara, PharmD, [facility] Clinical Pharmacist Please see AMION for all Pharmacists' Contact Phone Numbers 02/05/2018, 6:00 PM

## 2018-02-05 NOTE — H&P (Signed)
History and Physical  Allen KellChristy G Pinkerton ZOX:[id number] DOB: [birth year] DOA: 02/05/2018  PCP: Dema SeverinYork, Regina F, NP   Chief Complaint: fever, short of breath  HPI:  48 year old woman PMH factor V Leiden deficiency, factor II deficiency, pulmonary embolism, recent hospitalization for hemoptysis and pneumonia, presented to her primary care provider today for fever and shortness of breath, chest x-ray reportedly showed pneumonia when she sent to the emergency department for further evaluation where x-ray did confirm pneumonia and she was referred for further treatment.  She reports that she has had increasing shortness of breath and gets fatigued and short of breath even when sitting.  She has had fever up to 102 has been feeling poorly over the last week or more.  She had one episode of hemoptysis 1/17 but none since.  She reports she has had 5 episodes of pneumonia in the last year which is new.  ED Course: Treated with Levaquin and vancomycin  Review of Systems:  Negative for new visual changes, sore throat, rash, new muscle aches, chest pain, dysuria,   Positive for 2 episodes of diarrhea today  Past Medical History:  Diagnosis Date  . Anemia   . Arthritis   . Depression   . Factor II deficiency (HCC)   . Factor V Leiden (HCC)   . Headache(784.0)   . PFO (patent foramen ovale)    s/p closure  . Pulmonary embolism (HCC)   . Shortness of breath     Past Surgical History:  Procedure Laterality Date  . ABDOMINAL HYSTERECTOMY    . ANKLE SURGERY    . CHOLECYSTECTOMY    . gastri bypass 2003    . PATENT FORAMEN OVALE(PFO) CLOSURE    . TONSILLECTOMY       reports that she has never smoked. She has never used smokeless tobacco. She reports that she does not drink alcohol or use drugs. Mobility: Ambulatory  Allergies  Allergen Reactions  . Doxycycline     confusion  . Keflex [Cephalexin] Hives and Itching  . Latex     Hives, burning  . Paxil [Paroxetine Hcl]     Hives,  itching  . Prozac [Fluoxetine Hcl]     Hives, itching  . Sulfa Antibiotics     Hives     Family History  Problem Relation Age of Onset  . Bradycardia Father        Pacemaker  . Hypertension Father   . Hypertension Mother   . Lung cancer Mother        Deceased     Prior to Admission medications   Medication Sig Start Date End Date Taking? Authorizing Provider  aspirin EC 81 MG tablet Take 81 mg by mouth daily.   Yes [provider]  busPIRone (BUSPAR) 10 MG tablet Take 50 mg by mouth daily.   Yes [provider]  Eszopiclone (ESZOPICLONE) 3 MG TABS Take 3 mg by mouth at bedtime. Take immediately before bedtime   Yes [provider]  gabapentin (NEURONTIN) 600 MG tablet Take 600 mg by mouth 4 (four) times daily as needed (pain).    Yes [provider]  hydrochlorothiazide (HYDRODIURIL) 25 MG tablet Take 25 mg by mouth daily as needed (fluid).    Yes [provider]  mirtazapine (REMERON) 45 MG tablet Take 45 mg by mouth at bedtime.   Yes [provider]  Multiple Vitamin (MULTIVITAMIN WITH MINERALS) TABS tablet Take 1 tablet by mouth daily.   Yes [provider]  polyethylene  glycol (MIRALAX / GLYCOLAX) packet Take 17 g by mouth daily as needed for moderate constipation.   Yes [provider]  QUEtiapine (SEROQUEL) 400 MG tablet Take 400 mg by mouth at bedtime.   Yes [provider]  QUEtiapine (SEROQUEL) 50 MG tablet Take 50 mg by mouth at bedtime.   Yes [provider]  rivaroxaban (XARELTO) 20 MG TABS tablet Take 20 mg by mouth daily with supper.   Yes [provider]  venlafaxine XR (EFFEXOR-XR) 150 MG 24 hr capsule Take 150 mg by mouth daily with breakfast.   Yes [provider]  venlafaxine XR (EFFEXOR-XR) 150 MG 24 hr capsule Take 150 mg by mouth daily with breakfast.   Yes [provider]    Physical Exam: Vitals:   02/05/18 1800 02/05/18 1801  BP: 113/70  113/70  Pulse: 100 99  Resp:  16  Temp:    SpO2: (!) 81% 100%    Constitutional:   . Appears calm and comfortable Eyes:  . pupils and irises appear normal . Normal lids  ENMT:  . grossly normal hearing  . Lips appear normal Neck:  . neck appears normal, no masses . no thyromegaly Respiratory:  . Fair air movement, no definite wheezes, rales or rhonchi. Marland Kitchen. Respiratory effort mildly increased. Cardiovascular:  . RRR, no m/r/g . No LE extremity edema   Abdomen:  . Soft, nontender, nondistended.  No hernias noted. Musculoskeletal:  . Digits/nails BUE: no clubbing, cyanosis, petechiae, infection . RUE, LUE, RLE, LLE   o strength and tone normal, no atrophy, no abnormal movements o No tenderness, masses Skin:  . No rashes, lesions, ulcers . palpation of skin: no induration or nodules Psychiatric:  . Mental status o Mood, affect appropriate . judgment and insight appear intact    I have personally reviewed following labs and imaging studies  Labs:   Potassium 3.2, remainder CMP unremarkable.  Lactic acid 1.6.  Hemoglobin stable 10.6.  WBC within normal limits.  Imaging studies:   Chest x-ray apparently reviewed showed right lower lobe infiltrate  Medical tests:   EKG independently reviewed: Sinus tachycardia, no acute changes  Test discussed with performing physician:     Decision to obtain old records:      Review and summation of old records:      Principal Problem:   Lobar pneumonia (HCC) Active Problems:   Hypokalemia   Factor V Leiden (HCC)   Assessment/Plan Multifocal pneumonia, recurrent, etiology unclear and patient with hospitalization for hemoptysis earlier this month --Although afebrile she is short of breath at rest given significant infiltrate will start IV antibiotics and consult with pulmonology 1/23.  Hypokalemia --replete  Factor V Leiden, factor II deficiencies --Continue Xarelto  Severity of Illness: The appropriate  patient status for this patient is OBSERVATION. Observation status is judged to be reasonable and necessary in order to provide the required intensity of service to ensure the patient's safety. The patient's presenting symptoms, physical exam findings, and initial radiographic and laboratory data in the context of their medical condition is felt to place them at decreased risk for further clinical deterioration. Furthermore, it is anticipated that the patient will be medically stable for discharge from the hospital within 2 midnights of admission. The following factors support the patient status of observation.   " The patient's presenting symptoms include shortness of breath, fever. " The initial radiographic and laboratory data are dense right-sided infiltrates on chest x-ray.  DVT prophylaxis: Xarelto Code Status: Full Family  Communication: husband at bedside Consults called: please call pulmonary 1/23    Time spent: 60 minutes  Brendia Sacks, MD  [facility] Direct contact: see [email or web address]  7PM-7AM contact night coverage as above  02/05/2018, 6:48 PM

## 2018-02-05 NOTE — ED Notes (Signed)
Dinner tray ordered.

## 2018-02-05 NOTE — ED Notes (Signed)
ED Provider at bedside. 

## 2018-02-05 NOTE — ED Notes (Signed)
Goodrich, MD at bedside. 

## 2018-02-06 ENCOUNTER — Observation Stay (HOSPITAL_COMMUNITY): Payer: Medicaid Other

## 2018-02-06 DIAGNOSIS — J181 Lobar pneumonia, unspecified organism: Secondary | ICD-10-CM

## 2018-02-06 DIAGNOSIS — Z8673 Personal history of transient ischemic attack (TIA), and cerebral infarction without residual deficits: Secondary | ICD-10-CM | POA: Diagnosis not present

## 2018-02-06 DIAGNOSIS — Z7982 Long term (current) use of aspirin: Secondary | ICD-10-CM | POA: Diagnosis not present

## 2018-02-06 DIAGNOSIS — R404 Transient alteration of awareness: Secondary | ICD-10-CM

## 2018-02-06 DIAGNOSIS — R1310 Dysphagia, unspecified: Secondary | ICD-10-CM

## 2018-02-06 DIAGNOSIS — Z86711 Personal history of pulmonary embolism: Secondary | ICD-10-CM | POA: Diagnosis not present

## 2018-02-06 DIAGNOSIS — D6851 Activated protein C resistance: Secondary | ICD-10-CM | POA: Diagnosis present

## 2018-02-06 DIAGNOSIS — Z9884 Bariatric surgery status: Secondary | ICD-10-CM | POA: Diagnosis not present

## 2018-02-06 DIAGNOSIS — Z79899 Other long term (current) drug therapy: Secondary | ICD-10-CM | POA: Diagnosis not present

## 2018-02-06 DIAGNOSIS — F322 Major depressive disorder, single episode, severe without psychotic features: Secondary | ICD-10-CM | POA: Diagnosis present

## 2018-02-06 DIAGNOSIS — R509 Fever, unspecified: Secondary | ICD-10-CM | POA: Diagnosis not present

## 2018-02-06 DIAGNOSIS — K219 Gastro-esophageal reflux disease without esophagitis: Secondary | ICD-10-CM

## 2018-02-06 DIAGNOSIS — E876 Hypokalemia: Secondary | ICD-10-CM | POA: Diagnosis present

## 2018-02-06 DIAGNOSIS — T50905A Adverse effect of unspecified drugs, medicaments and biological substances, initial encounter: Secondary | ICD-10-CM

## 2018-02-06 DIAGNOSIS — M545 Low back pain: Secondary | ICD-10-CM | POA: Diagnosis present

## 2018-02-06 DIAGNOSIS — J690 Pneumonitis due to inhalation of food and vomit: Secondary | ICD-10-CM | POA: Diagnosis present

## 2018-02-06 DIAGNOSIS — Z7901 Long term (current) use of anticoagulants: Secondary | ICD-10-CM | POA: Diagnosis not present

## 2018-02-06 DIAGNOSIS — Z9049 Acquired absence of other specified parts of digestive tract: Secondary | ICD-10-CM | POA: Diagnosis not present

## 2018-02-06 DIAGNOSIS — Z90710 Acquired absence of both cervix and uterus: Secondary | ICD-10-CM | POA: Diagnosis not present

## 2018-02-06 LAB — HIV ANTIBODY (ROUTINE TESTING W REFLEX): HIV Screen 4th Generation wRfx: NONREACTIVE

## 2018-02-06 MED ORDER — QUETIAPINE FUMARATE 300 MG PO TABS
300.0000 mg | ORAL_TABLET | Freq: Every day | ORAL | Status: DC
  Administered 2018-02-06: 300 mg via ORAL
  Filled 2018-02-06: qty 1

## 2018-02-06 MED ORDER — DIPHENHYDRAMINE HCL 50 MG/ML IJ SOLN: 25.0000 mg | Freq: Three times a day (TID) | INTRAMUSCULAR | Status: DC | PRN

## 2018-02-06 MED ORDER — SODIUM CHLORIDE 0.9 % IV SOLN
3.0000 g | Freq: Four times a day (QID) | INTRAVENOUS | Status: DC
  Administered 2018-02-06 – 2018-02-07 (×4): 3 g via INTRAVENOUS
  Filled 2018-02-06 (×6): qty 3

## 2018-02-06 MED ORDER — GABAPENTIN 600 MG PO TABS
300.0000 mg | ORAL_TABLET | Freq: Three times a day (TID) | ORAL | Status: DC
  Administered 2018-02-06 – 2018-02-07 (×4): 300 mg via ORAL
  Filled 2018-02-06 (×4): qty 1

## 2018-02-06 MED ORDER — OXYCODONE-ACETAMINOPHEN 5-325 MG PO TABS
1.0000 | ORAL_TABLET | Freq: Once | ORAL | Status: AC
  Administered 2018-02-06: 1 via ORAL
  Filled 2018-02-06: qty 1

## 2018-02-06 NOTE — Discharge Instructions (Addendum)
Community-Acquired Pneumonia, Adult Pneumonia is an infection of the lungs. There are different types of pneumonia. One type can develop while a person is in a hospital. A different type, called community-acquired pneumonia, develops in people who are not, or have not recently been, in the hospital or other health care facility. What are the causes?  Pneumonia may be caused by bacteria, viruses, or funguses. Community-acquired pneumonia is often caused by Streptococcus pneumonia bacteria. These bacteria are often passed from one person to another by breathing in droplets from the cough or sneeze of an infected person. What increases the risk? The condition is more likely to develop in:  People who havechronic diseases, such as chronic obstructive pulmonary disease (COPD), asthma, congestive heart failure, cystic fibrosis, diabetes, or kidney disease.  People who haveearly-stage or late-stage HIV.  People who havesickle cell disease.  People who havehad their spleen removed (splenectomy).  People who havepoor Human resources officer.  People who havemedical conditions that increase the risk of breathing in (aspirating) secretions their own mouth and nose.  People who havea weakened immune system (immunocompromised).  People who smoke.  People whotravel to areas where pneumonia-causing germs commonly exist.  People whoare around animal habitats or animals that have pneumonia-causing germs, including birds, bats, rabbits, cats, and farm animals. What are the signs or symptoms? Symptoms of this condition include:  Adry cough.  A wet (productive) cough.  Fever.  Sweating.  Chest pain, especially when breathing deeply or coughing.  Rapid breathing or difficulty breathing.  Shortness of breath.  Shaking chills.  Fatigue.  Muscle aches. How is this diagnosed? Your health care provider will take a medical history and perform a physical exam. You may also have other  tests, including:  Imaging studies of your chest, including X-rays.  Tests to check your blood oxygen level and other blood gases.  Other tests on blood, mucus (sputum), fluid around your lungs (pleural fluid), and urine. If your pneumonia is severe, other tests may be done to identify the specific cause of your illness. How is this treated? The type of treatment that you receive depends on many factors, such as the cause of your pneumonia, the medicines you take, and other medical conditions that you have. For most adults, treatment and recovery from pneumonia may occur at home. In some cases, treatment must happen in a hospital. Treatment may include:  Antibiotic medicines, if the pneumonia was caused by bacteria.  Antiviral medicines, if the pneumonia was caused by a virus.  Medicines that are given by mouth or through an IV tube.  Oxygen.  Respiratory therapy. Although rare, treating severe pneumonia may include:  Mechanical ventilation. This is done if you are not breathing well on your own and you cannot maintain a safe blood oxygen level.  Thoracentesis. This procedureremoves fluid around one lung or both lungs to help you breathe better. Follow these instructions at home:   Take over-the-counter and prescription medicines only as told by your health care provider. ? Only takecough medicine if you are losing sleep. Understand that cough medicine can prevent your bodys natural ability to remove mucus from your lungs. ? If you were prescribed an antibiotic medicine, take it as told by your health care provider. Do not stop taking the antibiotic even if you start to feel better.  Sleep in a semi-upright position at night. Try sleeping in a reclining chair, or place a few pillows under your head.  Do not use tobacco products, including cigarettes, chewing tobacco, and  e-cigarettes. If you need help quitting, ask your health care provider.  Drink enough water to keep your  urine clear or pale yellow. This will help to thin out mucus secretions in your lungs. How is this prevented? There are ways that you can decrease your risk of developing community-acquired pneumonia. Consider getting a pneumococcal vaccine if:  You are older than 47 years of age.  You are older than 47 years of age and are undergoing cancer treatment, have chronic lung disease, or have other medical conditions that affect your immune system. Ask your health care provider if this applies to you. There are different types and schedules of pneumococcal vaccines. Ask your health care provider which vaccination option is best for you. You may also prevent community-acquired pneumonia if you take these actions:  Get an influenza vaccine every year. Ask your health care provider which type of influenza vaccine is best for you.  Go to the dentist on a regular basis.  Wash your hands often. Use hand sanitizer if soap and water are not available. Contact a health care provider if:  You have a fever.  You are losing sleep because you cannot control your cough with cough medicine. Get help right away if:  You have worsening shortness of breath.  You have increased chest pain.  Your sickness becomes worse, especially if you are an older adult or have a weakened immune system.  You cough up blood. This information is not intended to replace advice given to you by your health care provider. Make sure you discuss any questions you have with your health care provider. Document Released: 01/01/2005 Document Revised: 09/20/2016 Document Reviewed: 04/28/2014 Elsevier Interactive Patient Education  2019 [employer] Follow with Primary MD Dema Severin, NP in 7 days   Get CBC, CMP, 2 view Chest X ray -  checked  by Primary MD in 5-7 days   Activity: As tolerated with Full fall precautions use walker/cane & assistance as needed  Disposition Home    Diet: Heart Healthy   Special Instructions:  If you have smoked or chewed Tobacco  in the last 2 yrs please stop smoking, stop any regular Alcohol  and or any Recreational drug use.  On your next visit with your primary care physician please Get Medicines reviewed and adjusted.  Please request your Prim.MD to go over all Hospital Tests and Procedure/Radiological results at the follow up, please get all Hospital records sent to your Prim MD by signing hospital release before you go home.  If you experience worsening of your admission symptoms, develop shortness of breath, life threatening emergency, suicidal or homicidal thoughts you must seek medical attention immediately by calling 911 or calling your MD immediately  if symptoms less severe.  You Must read complete instructions/literature along with all the possible adverse reactions/side effects for all the Medicines you take and that have been prescribed to you. Take any new Medicines after you have completely understood and accpet all the possible adverse reactions/side effects.      Information on my medicine - XARELTO (rivaroxaban)  WHY WAS XARELTO PRESCRIBED FOR YOU? Xarelto was prescribed to treat blood clots that may have been found in the veins of your legs (deep vein thrombosis) or in your lungs (pulmonary embolism) and to reduce the risk of them occurring again.  What do you need to know about Xarelto? The dose is one 20 mg tablet taken ONCE A DAY with your evening meal.  DO NOT stop taking  Xarelto without talking to the health care provider who prescribed the medication.  Refill your prescription for 20 mg tablets before you run out.  After discharge, you should have regular check-up appointments with your healthcare provider that is prescribing your Xarelto.  In the future your dose may need to be changed if your kidney function changes by a significant amount.  What do you do if you miss a dose? If you are taking Xarelto TWICE DAILY and you miss a dose, take it  as soon as you remember. You may take two 15 mg tablets (total 30 mg) at the same time then resume your regularly scheduled 15 mg twice daily the next day.  If you are taking Xarelto ONCE DAILY and you miss a dose, take it as soon as you remember on the same day then continue your regularly scheduled once daily regimen the next day. Do not take two doses of Xarelto at the same time.   Important Safety Information Xarelto is a blood thinner medicine that can cause bleeding. You should call your healthcare provider right away if you experience any of the following: ? Bleeding from an injury or your nose that does not stop. ? Unusual colored urine (red or dark brown) or unusual colored stools (red or black). ? Unusual bruising for unknown reasons. ? A serious fall or if you hit your head (even if there is no bleeding).  Some medicines may interact with Xarelto and might increase your risk of bleeding while on Xarelto. To help avoid this, consult your healthcare provider or pharmacist prior to using any new prescription or non-prescription medications, including herbals, vitamins, non-steroidal anti-inflammatory drugs (NSAIDs) and supplements.  This website has more information on Xarelto: VisitDestination.com.br.

## 2018-02-06 NOTE — Consult Note (Addendum)
NAME:  Colleen Olsen, MRN:  [id number], DOB:  [birth year], LOS: 0 ADMISSION DATE:  02/05/2018, CONSULTATION DATE:  1/23 REFERRING MD:  singh, CHIEF COMPLAINT: Recurrent pneumonia  Brief History   48 year old female admitted on 1/22 with recurrent pneumonia  History of present illness   48 year old female with history as listed below, presented to the emergency room on 1/22 With chief complaint of fever and shortness of breath.  Apparently symptoms initiated on 1/18 and 1/19 with fever, cough, and general malaise..  Temperature as high as 102 Fahrenheit.  Had one episode of hemoptysis during that time.  She treated symptomatically with cough and cold medications over-the-counter, however symptoms continued and she developed worsening shortness of breath and because of that went to her primary care provider on the 22nd.  A chest x-ray was obtained during that visit demonstrated right greater than left pneumonia, she was referred to the hospital for admission as she has had 5 prior bouts of pneumonia in the recent year.  Pulmonary was asked to see her due to recurrent pneumonia.   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  Prior PFO, with subsequent CVA, no notable deficits.  Severe depression on multiple medications  Significant Hospital Events     Consults:  Pulmonary consulted on 1/23  Procedures:  None indicated  Significant Diagnostic Tests:  Esophagram 1/23  Micro Data:    Antimicrobials:  Unasyn 1/23 Receive vancomycin and levofloxacin on the 22nd  Interim history/subjective:  Feels better  Objective   Blood pressure 107/70, pulse 85, temperature 97.8 F (36.6 C), temperature source Oral, resp. rate 18, height 5\' 7"  (1.702 m), weight 93.9 kg, SpO2 96 %.        Intake/Output Summary (Last 24 hours) at 02/06/2018 1035 Last data filed at 02/05/2018 1924 Gross per 24 hour  Intake 1000 ml  Output no documentation    Net 1000 ml   Filed Weights   02/05/18 1751 02/05/18 2328  Weight: 93.9 kg 93.9 kg    Examination: General: 48 year old white female resting in bed she is in no acute distress HENT: Normocephalic atraumatic mucous membranes moist no jugular venous distention Lungs: Right-sided rales no accessory use diminished left base Cardiovascular: Regular rate and rhythm without murmur rub or gallop Abdomen: Soft nontender Extremities: No significant edema Neuro: Awake oriented no focal deficits GU: Voiding  Resolved Hospital Problem list     Assessment & Plan:  Recurrent aspiration PNA H/o PE 2/2 factor V Leiden def  Major depression    Recurrent aspiration PNA. I suspect that this is multifactorial: symtpomatically reports significant reflux and nocturnal regurgitation 5 out of 7 nights a week this may also be exacerbated my her multiple sedating drug regimen at HS Plan Cont abx; rx 7d course repeat imaging prior to dc Discussed reflux precautions (avoid PO intake 1 hr before bed, elevate HOB) Esophagram: if has stricture would benefit from out pt GI eval  Add PPI   Best practice:  Diet: reg Pain/Anxiety/Delirium protocol (if indicated): NI VAP protocol (if indicated): NI DVT prophylaxis: DOAC GI prophylaxis: PPI added  Glucose control: NI Mobility: OOB Code Status: Full code  Family Communication: updated  Disposition:   Labs   CBC: Recent Labs  Lab 02/05/18 1631  WBC 9.4  NEUTROABS 7.2  HGB 10.6*  HCT 35.3*  MCV 89.4  PLT 352    Basic Metabolic Panel: Recent Labs  Lab 02/05/18 1631  NA 136  K 3.2*  CL 99  CO2 25  GLUCOSE 97  BUN 13  CREATININE 0.68  CALCIUM 8.4*   GFR: Estimated Creatinine Clearance: 103.3 mL/min (by C-G formula based on SCr of 0.68 mg/dL). Recent Labs  Lab 02/05/18 1631  WBC 9.4  LATICACIDVEN 1.6    Liver Function Tests: Recent Labs  Lab 02/05/18 1631  AST 17  ALT 13  ALKPHOS 70  BILITOT 0.4  PROT 6.0*  ALBUMIN 2.7*    No results for input(s): LIPASE, AMYLASE in the last 168 hours. No results for input(s): AMMONIA in the last 168 hours.  ABG No results found for: PHART, PCO2ART, PO2ART, HCO3, TCO2, ACIDBASEDEF, O2SAT   Coagulation Profile: No results for input(s): INR, PROTIME in the last 168 hours.  Cardiac Enzymes: No results for input(s): CKTOTAL, CKMB, CKMBINDEX, TROPONINI in the last 168 hours.  HbA1C: No results found for: HGBA1C  CBG: No results for input(s): GLUCAP in the last 168 hours.  Review of Systems:   Review of Systems - History obtained from the patient General ROS: positive for  - fatigue, fever and malaise negative for - hot flashes, night sweats, sleep disturbance, weight gain or weight loss Psychological ROS: negative ENT ROS: negative Allergy and Immunology ROS: negative Hematological and Lymphatic ROS: negative Endocrine ROS: negative Respiratory ROS: positive for - cough, hemoptysis, shortness of breath and sputum changes negative for - orthopnea, pleuritic pain, stridor or wheezing Cardiovascular ROS: positive for - dyspnea on exertion Gastrointestinal ROS: positive for - gas/bloating, heartburn and reports reflux 5 out of 7 days a week. at times wakes up regurgitating  negative for - abdominal pain, change in bowel habits, change in stools, constipation, diarrhea, stool incontinence or swallowing difficulty/pain Genito-Urinary ROS: no dysuria, trouble voiding, or hematuria Musculoskeletal ROS: negative Neurological ROS: no TIA or stroke symptoms  Past Medical History  She,  has a past medical history of Anemia, Arthritis, Depression, Factor II deficiency (HCC), Factor V Leiden (HCC), Headache(784.0), PFO (patent foramen ovale), Pulmonary embolism (HCC), and Shortness of breath.   Surgical History    Past Surgical History:  Procedure Laterality Date  . ABDOMINAL HYSTERECTOMY    . ANKLE SURGERY    . CHOLECYSTECTOMY    . gastri bypass 2003    . PATENT  FORAMEN OVALE(PFO) CLOSURE    . TONSILLECTOMY       Social History   reports that she has never smoked. She has never used smokeless tobacco. She reports that she does not drink alcohol or use drugs.   Family History   Her family history includes Bradycardia in her father; Hypertension in her father and mother; Lung cancer in her mother.   Allergies Allergies  Allergen Reactions  . Doxycycline     confusion  . Keflex [Cephalexin] Hives and Itching  . Latex     Hives, burning  . Paxil [Paroxetine Hcl]     Hives, itching  . Prozac [Fluoxetine Hcl]     Hives, itching  . Sulfa Antibiotics     Hives      Home Medications  Prior to Admission medications   Medication Sig Start Date End Date Taking? Authorizing Provider  aspirin EC 81 MG tablet Take 81 mg by mouth daily.   Yes [provider]  busPIRone (BUSPAR) 10 MG tablet Take 10 mg by mouth 3 (three) times daily.    Yes [provider]  Eszopiclone (ESZOPICLONE) 3 MG TABS Take 3 mg by mouth at bedtime. Take immediately before bedtime   Yes  [provider]  gabapentin (NEURONTIN) 600 MG tablet Take 600 mg by mouth 4 (four) times daily as needed (pain).    Yes [provider]  hydrochlorothiazide (HYDRODIURIL) 25 MG tablet Take 25 mg by mouth daily as needed (fluid).    Yes [provider]  mirtazapine (REMERON) 45 MG tablet Take 45 mg by mouth at bedtime.   Yes [provider]  Multiple Vitamin (MULTIVITAMIN WITH MINERALS) TABS tablet Take 1 tablet by mouth daily.   Yes [provider]  oxycodone (OXY-IR) 5 MG capsule Take 10 mg by mouth every 4 (four) hours as needed.   Yes [provider]  polyethylene glycol (MIRALAX / GLYCOLAX) packet Take 17 g by mouth daily as needed for moderate constipation.   Yes [provider]  QUEtiapine (SEROQUEL) 400 MG tablet Take 400 mg by mouth at bedtime.   Yes [provider]  QUEtiapine (SEROQUEL) 50 MG  tablet Take 50 mg by mouth at bedtime.   Yes [provider]  rivaroxaban (XARELTO) 20 MG TABS tablet Take 20 mg by mouth daily with supper.   Yes [provider]  venlafaxine XR (EFFEXOR-XR) 150 MG 24 hr capsule Take 150 mg by mouth daily with breakfast.   Yes [provider]  venlafaxine XR (EFFEXOR-XR) 150 MG 24 hr capsule Take 150 mg by mouth daily with breakfast.   Yes [provider]     Critical care time: n/a    Simonne MartinetPeter E Babcock ACNP-BC [facility] Pager # [phone] OR # [phone] if no answer  Attending Note:  48 year old female s/p recent CVA who presents to PCCM with recurrent pneumonia.  Feels better since admission, no new complaints.  On exam, R>L basilar crackles with a strong cough.  Reports significant reflux symptoms.  I reviewed chest CT myself, RLL infiltrate with significant debris in the RLL airway.  Discussed with PCCM-NP and TRH-MD.  Recurrent aspiration pneumonia:  - 7 day course of abx as ordered  - Repeat CT prior to discharge  - No need for bronchoscopy right now, if does not resolve with abx will reconsider  Dysphagia:  - SLP  - May need to discuss need for such high doses of so many sedating medications  Reflux:  - PPI  - Esophagram ordered  - Head of the bed elevation  Excessive use of sedating medications:   - May need psych involvement to limit such a high dose need  PCCM will sign off, please call back if needed.  Patient seen and examined, agree with above note.  I dictated the care and orders written for this patient under my direction.  Alyson ReedyYacoub, Wesam G, MD [phone]

## 2018-02-06 NOTE — Evaluation (Signed)
Clinical/Bedside Swallow Evaluation Patient Details  Name: Allen KellChristy G Dartt MRN: [id number] Date of Birth: [birth year]  Today's Date: 02/06/2018 Time: SLP Start Time (ACUTE ONLY): 1118 SLP Stop Time (ACUTE ONLY): 1131 SLP Time Calculation (min) (ACUTE ONLY): 13 min  Past Medical History:  Past Medical History:  Diagnosis Date  . Anemia   . Arthritis   . Depression   . Factor II deficiency (HCC)   . Factor V Leiden (HCC)   . Headache(784.0)   . PFO (patent foramen ovale)    s/p closure  . Pulmonary embolism (HCC)   . Shortness of breath    Past Surgical History:  Past Surgical History:  Procedure Laterality Date  . ABDOMINAL HYSTERECTOMY    . ANKLE SURGERY    . CHOLECYSTECTOMY    . gastri bypass 2003    . PATENT FORAMEN OVALE(PFO) CLOSURE    . TONSILLECTOMY     HPI:  Pt is a 48 year old woman admitted with PNA,. This is her fifth occurence over the past year per MD note, which also notes concern for silent aspiration in the setting of sedating medications. PMH also includes: factor V Leiden deficiency, factor II deficiency, pulmonary embolism on Xarelto   Assessment / Plan / Recommendation Clinical Impression  Pt's oropharyngeal swallow appears functional, although she does describe frequent regurgitation at night and hearburn sensation during the day. Suspect she is at highest risk for aspiration post-prandially. Note that esophagram has already been ordered by MD. Would continue current diet (regular textures, thin liquids) pending results of that test. Will f/u for any additional SLP intervention pending results as well. Education was provided today about general aspiration and esophageal precautions.  SLP Visit Diagnosis: Dysphagia, unspecified (R13.10)    Aspiration Risk  Mild aspiration risk;Moderate aspiration risk    Diet Recommendation Regular;Thin liquid   Liquid Administration via: Cup;Straw Medication Administration: Whole meds with liquid Supervision:  Patient able to self feed Compensations: Slow rate;Small sips/bites Postural Changes: Seated upright at 90 degrees;Remain upright for at least 30 minutes after po intake    Other  Recommendations Oral Care Recommendations: Oral care BID   Follow up Recommendations (tba)      Frequency and Duration            Prognosis        Swallow Study   General HPI: Pt is a 48 year old woman admitted with PNA,. This is her fifth occurence over the past year per MD note, which also notes concern for silent aspiration in the setting of sedating medications. PMH also includes: factor V Leiden deficiency, factor II deficiency, pulmonary embolism on Xarelto Type of Study: Bedside Swallow Evaluation Previous Swallow Assessment: none in chart Diet Prior to this Study: Regular;Thin liquids Temperature Spikes Noted: No Respiratory Status: Room air History of Recent Intubation: No Behavior/Cognition: Alert;Cooperative Oral Cavity Assessment: Within Functional Limits Oral Care Completed by SLP: No Oral Cavity - Dentition: Adequate natural dentition Vision: Functional for self-feeding Self-Feeding Abilities: Able to feed self Patient Positioning: Upright in bed Baseline Vocal Quality: Normal Volitional Cough: Strong Volitional Swallow: Able to elicit    Oral/Motor/Sensory Function Overall Oral Motor/Sensory Function: Within functional limits   Ice Chips Ice chips: Not tested   Thin Liquid Thin Liquid: Within functional limits Presentation: Self Fed;Straw    Nectar Thick Nectar Thick Liquid: Not tested   Honey Thick Honey Thick Liquid: Not tested   Puree Puree: Not tested   Solid     Solid: Within functional limits Presentation: Self Fed  Virl Axe Shamina Etheridge 02/06/2018,11:43 AM   Maxcine Ham Rhyse Skowron, M.A. CCC-SLP Acute Herbalist [phone] Office [phone]

## 2018-02-06 NOTE — Progress Notes (Addendum)
PROGRESS NOTE                                                                                                                                                                                                             Patient Demographics:    Colleen HaberChristy Olsen, is a 48 y.o. female, DOB - [birth year], ZOX:[id number]  Admit date - 02/05/2018   Admitting Physician Standley Brookinganiel P Goodrich, MD  Outpatient Primary MD for the patient is Dema SeverinYork, Regina F, NP  LOS - 0  Chief Complaint  Patient presents with  . 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  She has had 5 episodes of pneumonia in the last 1 year now.   Subjective:    Colleen Olsen today has, No headache, No chest pain, No abdominal pain - No Nausea, No new weakness tingling or numbness, No Cough - SOB.     Assessment  & Plan :     1.  Recurrent pneumonia.  Likely due to silent aspiration which happens due to patient being on very high doses of Seroquel at night in conjunction with high doses of narcotics which she takes 3 times a day with 1 dose being at night.  At this time she does not appear toxic, will downgrade her to Unasyn, follow cultures, supportive care with oxygen and nebulizer treatments to be continued, will request pulmonary to evaluate one-time along with speech.  Patient counseled to cut down on her narcotics completely.  2.  Low back pain.  Stable.  In no distress.  On high doses of narcotics at home, counseled to gradually taper off.  Request PCP to kindly make sure patient is not taking sedative medications  on a regular basis.  3.  Factor V Leiden deficiency with history of PE.  Continue Xarelto   Request patient's outpatient primary care team to kindly cut down patient's sedative medications, she is on the following medications at home, Seroquel 450 p.o. nightly, Remeron 45 nightly, Neurontin 600 4 times a day, BuSpar 10 mg nightly + high dose Narcotic TID.    Family Communication  :  None  Code Status :  Full  Disposition Plan  :  Home 1-2 days  Consults  :  PCCM  Procedures  :    CT - 1 mth ago - 1. No evidence for acute pulmonary embolus. 2. Large area of airspace consolidation within the right lower lobe is identified, new from 11/24/2017 and worrisome for pneumonia. Scattered areas of ground-glass attenuation throughout the remaining portions of the lungs are likely infectious/inflammatory in etiology.  DVT Prophylaxis  :  Xaralto  Lab Results  Component Value Date   PLT 352 02/05/2018    Diet :  Diet Order            Diet regular Room service appropriate? Yes; Fluid consistency: Thin  Diet effective now               Inpatient Medications Scheduled Meds: . aspirin EC  81 mg Oral Daily  . busPIRone  10 mg Oral TID  . QUEtiapine  400 mg Oral QHS  . QUEtiapine  50 mg Oral QHS  . rivaroxaban  20 mg Oral Q supper  . venlafaxine XR  150 mg Oral Q breakfast   Continuous Infusions: . ampicillin-sulbactam (UNASYN) IV     PRN Meds:.acetaminophen **OR** [DISCONTINUED] acetaminophen, diphenhydrAMINE, gabapentin, hydrochlorothiazide, polyethylene glycol  Antibiotics  :   Anti-infectives (From admission, onward)   Start     Dose/Rate Route Frequency Ordered Stop   02/06/18 0945  Ampicillin-Sulbactam (UNASYN) 3 g in sodium chloride 0.9 % 100 mL IVPB     3 g 200 mL/hr over 30 Minutes Intravenous Every 6 hours 02/06/18 0931     02/06/18 0600  vancomycin (VANCOCIN) 1,500 mg in sodium chloride 0.9 % 500 mL IVPB  Status:  Discontinued     1,500 mg 250 mL/hr over 120  Minutes Intravenous Every 12 hours 02/05/18 1758 02/06/18 0930   02/05/18 1900  levofloxacin (LEVAQUIN) IVPB 750 mg  Status:  Discontinued     750 mg 100 mL/hr over 90 Minutes Intravenous Every 24 hours 02/05/18 1849 02/06/18 0931   02/05/18 1800  vancomycin (VANCOCIN) 2,000 mg in sodium chloride 0.9 % 500 mL IVPB     2,000 mg 250 mL/hr over 120 Minutes Intravenous  Once 02/05/18 1758 02/05/18 2109   02/05/18 1745  levofloxacin (LEVAQUIN) IVPB 750 mg     750 mg 100 mL/hr over 90 Minutes Intravenous  Once 02/05/18 1739 02/05/18 2016          Objective:   Vitals:   02/05/18 1930 02/05/18 2041 02/05/18 2328 02/06/18 0449  BP: 120/84 119/80  107/70  Pulse: 100 (!) 39  85  Resp: 16 18  18   Temp:  98.7 F (37.1 C)  97.8 F (36.6 C)  TempSrc:  Oral  Oral  SpO2: 98% 90%  96%  Weight:   93.9 kg   Height:   5\' 7"  (1.702 m)     Wt Readings from Last 3 Encounters:  02/05/18 93.9 kg  01/20/18 94.2 kg  09/05/12 102.6 kg     Intake/Output Summary (Last 24 hours) at 02/06/2018 0932 Last data filed at 02/05/2018 1924 Gross per 24 hour  Intake 1000 ml  Output -  Net 1000 ml     Physical Exam  Awake Alert, Oriented X 3, No new F.N deficits, Normal affect [place].AT,PERRAL Supple Neck,No JVD, No cervical lymphadenopathy appriciated.  Symmetrical Chest wall movement, Good air movement bilaterally, R.basilar rales RRR,No Gallops,Rubs or new Murmurs, No Parasternal Heave +ve B.Sounds, Abd Soft, No tenderness, No organomegaly appriciated,  No rebound - guarding or rigidity. No Cyanosis, Clubbing or edema, No new Rash or bruise     Data Review:    CBC Recent Labs  Lab 02/05/18 1631  WBC 9.4  HGB 10.6*  HCT 35.3*  PLT 352  MCV 89.4  MCH 26.8  MCHC 30.0  RDW 15.9*  LYMPHSABS 1.4  MONOABS 0.5  EOSABS 0.2  BASOSABS 0.1    Chemistries  Recent Labs  Lab 02/05/18 1631  NA 136  K 3.2*  CL 99  CO2 25  GLUCOSE 97  BUN 13  CREATININE 0.68  CALCIUM 8.4*  AST 17  ALT 13    ALKPHOS 70  BILITOT 0.4   ------------------------------------------------------------------------------------------------------------------ No results for input(s): CHOL, HDL, LDLCALC, TRIG, CHOLHDL, LDLDIRECT in the last 72 hours.  No results found for: HGBA1C ------------------------------------------------------------------------------------------------------------------ No results for input(s): TSH, T4TOTAL, T3FREE, THYROIDAB in the last 72 hours.  Invalid input(s): FREET3 ------------------------------------------------------------------------------------------------------------------ No results for input(s): VITAMINB12, FOLATE, FERRITIN, TIBC, IRON, RETICCTPCT in the last 72 hours.  Coagulation profile No results for input(s): INR, PROTIME in the last 168 hours.  No results for input(s): DDIMER in the last 72 hours.  Cardiac Enzymes No results for input(s): CKMB, TROPONINI, MYOGLOBIN in the last 168 hours.  Invalid input(s): CK ------------------------------------------------------------------------------------------------------------------ No results found for: BNP  Micro Results No results found for this or any previous visit (from the past 240 hour(s)).  Radiology Reports Dg Chest 2 View  Result Date: 02/05/2018 CLINICAL DATA:  Recurrent pneumonia. EXAM: CHEST - 2 VIEW COMPARISON:  January 17, 2018 FINDINGS: Multifocal infiltrates are seen in the lungs, significantly worsened since January 17, 2018. The opacities are most prominent in the right lung, possibly involving the right upper lobe on today's study. There also appears to be some mild patchy opacity in the left base which is new. No change in the cardiomediastinal silhouette. No pneumothorax. No other acute abnormalities. IMPRESSION: 1. Multifocal infiltrates, significantly worsened since January 17, 2018. The worsening despite treatment over 19 days raises the possibility of a poorly treated pneumonia, an atypical  infection, and aspiration. Other etiologies are possible. Recommend clinical correlation and follow-up to resolution. If the patient's symptoms continue to worsen and/or persist, recommend pulmonary consultation for further assessment. Electronically Signed   By: Gerome Samavid  Williams III M.D   On: 02/05/2018 17:29   Ct Angio Chest Pe W Or Wo Contrast  Result Date: 01/17/2018 CLINICAL DATA:  Evaluate for pulmonary embolism. Hemoptysis. Previous pulmonary embolus in 04/2017 EXAM: CT ANGIOGRAPHY CHEST WITH CONTRAST TECHNIQUE: Multidetector CT imaging of the chest was performed using the standard protocol during bolus administration of intravenous contrast. Multiplanar CT image reconstructions and MIPs were obtained to evaluate the vascular anatomy. CONTRAST:  100mL ISOVUE-370 IOPAMIDOL (ISOVUE-370) INJECTION 76% COMPARISON:  03/07/2017 FINDINGS: Cardiovascular: Normal heart size. No pericardial effusion identified. The main pulmonary artery is patent. There is no saddle embolus. No central obstructing pulmonary emboli identified bilaterally. No lobar or segmental pulmonary artery filling defects identified. Mediastinum/Nodes: No enlarged mediastinal, hilar, or axillary lymph nodes. Thyroid gland, trachea, and esophagus demonstrate no significant findings. Lungs/Pleura: Large area of dense airspace consolidation within the posterior right lower lobe is identified. There are patchy areas of ground-glass attenuation scattered throughout the remaining portions of the lungs. Calcified granuloma identified within the right upper lobe. Upper Abdomen: Previous cholecystectomy.  No acute abnormality. Musculoskeletal: No chest wall abnormality. No acute or significant osseous findings. Review of the MIP images confirms the above findings. IMPRESSION: 1. No evidence  for acute pulmonary embolus. 2. Large area of airspace consolidation within the right lower lobe is identified, new from 11/24/2017 and worrisome for pneumonia.  Scattered areas of ground-glass attenuation throughout the remaining portions of the lungs are likely infectious/inflammatory in etiology. Electronically Signed   By: Signa Kell M.D.   On: 01/17/2018 16:46   Dg Chest Port 1 View  Result Date: 01/17/2018 CLINICAL DATA:  Acute onset of hemoptysis. Patient on Xarelto. EXAM: PORTABLE CHEST 1 VIEW COMPARISON:  Chest radiograph performed earlier today at 12:07 a.m. FINDINGS: The lungs are well-aerated. Persistent right basilar airspace opacity is suspicious for pneumonia. As before, aspiration cannot be excluded. There is no evidence of pleural effusion or pneumothorax. The cardiomediastinal silhouette is normal in size. Postoperative change is noted overlying the mediastinum. No acute osseous abnormalities are seen. IMPRESSION: Persistent right basilar airspace opacity is suspicious for pneumonia, stable from the recent prior study. As before, aspiration cannot be excluded. Electronically Signed   By: Roanna Raider M.D.   On: 01/17/2018 03:59    Time Spent in minutes  30   Susa Raring M.D on 02/06/2018 at 9:32 AM  To page go to [email or web address] - password [facility]

## 2018-02-07 LAB — POTASSIUM: Potassium: 3.1 mmol/L — ABNORMAL LOW (ref 3.5–5.1)

## 2018-02-07 MED ORDER — QUETIAPINE FUMARATE 200 MG PO TABS: 200.0000 mg | ORAL_TABLET | Freq: Every day | ORAL | 0 refills | Status: AC

## 2018-02-07 MED ORDER — AMOXICILLIN-POT CLAVULANATE 875-125 MG PO TABS: 1.0000 | ORAL_TABLET | Freq: Two times a day (BID) | ORAL | 0 refills | Status: AC

## 2018-02-07 MED ORDER — GABAPENTIN 600 MG PO TABS: 300.0000 mg | ORAL_TABLET | Freq: Three times a day (TID) | ORAL | 0 refills | Status: DC

## 2018-02-07 MED ORDER — OXYCODONE HCL 5 MG PO CAPS: 5.0000 mg | ORAL_CAPSULE | Freq: Four times a day (QID) | ORAL | 0 refills | Status: AC | PRN

## 2018-02-07 MED ORDER — PANTOPRAZOLE SODIUM 40 MG PO TBEC: 40.0000 mg | DELAYED_RELEASE_TABLET | Freq: Every day | ORAL | 0 refills | Status: AC

## 2018-02-07 MED ORDER — SODIUM CHLORIDE 0.9% FLUSH: 10.0000 mL | INTRAVENOUS | Status: DC | PRN

## 2018-02-07 MED ORDER — POTASSIUM CHLORIDE 10 MEQ/100ML IV SOLN
10.0000 meq | INTRAVENOUS | Status: AC
  Administered 2018-02-07 (×2): 10 meq via INTRAVENOUS
  Filled 2018-02-07 (×2): qty 100

## 2018-02-07 MED ORDER — MAGNESIUM SULFATE IN D5W 1-5 GM/100ML-% IV SOLN
1.0000 g | Freq: Once | INTRAVENOUS | Status: AC
  Administered 2018-02-07: 1 g via INTRAVENOUS
  Filled 2018-02-07: qty 100

## 2018-02-07 MED ORDER — POTASSIUM CHLORIDE CRYS ER 20 MEQ PO TBCR
40.0000 meq | EXTENDED_RELEASE_TABLET | Freq: Once | ORAL | Status: AC
  Administered 2018-02-07: 40 meq via ORAL
  Filled 2018-02-07: qty 2

## 2018-02-07 MED ORDER — PANTOPRAZOLE SODIUM 40 MG PO TBEC
40.0000 mg | DELAYED_RELEASE_TABLET | Freq: Every day | ORAL | Status: DC
  Administered 2018-02-07: 40 mg via ORAL
  Filled 2018-02-07: qty 1

## 2018-02-07 NOTE — Care Management Note (Signed)
Case Management Note  Patient Details  Name: Colleen Olsen MRN: [id number] Date of Birth: [birth year]  Subjective/Objective:  Recurrent PNA, low back pain                  Action/Plan: Spoke to pt and states she can afford her medications. Printed coupons from [employer] for Augmentin $19 and Gabapentin $22.   PCP Mauricio Po MD   Expected Discharge Date:  02/07/18               Expected Discharge Plan:  Home/Self Care  In-House Referral:  NA  Discharge planning Services  CM Consult  Post Acute Care Choice:  NA Choice offered to:  NA  DME Arranged:  N/A DME Agency:  NA  HH Arranged:  NA HH Agency:  NA  Status of Service:  Completed, signed off  If discussed at Long Length of Stay Meetings, dates discussed:    Additional Comments:  Elliot Cousin, RN 02/07/2018, 10:14 AM

## 2018-02-07 NOTE — Progress Notes (Signed)
Pt given discharge instructions, prescriptions, and care notes. Pt verbalized understanding AEB no further questions or concerns at this time. IV was discontinued, no redness, pain, or swelling noted at this time.  Pt left the floor via wheelchair with staff in stable condition. Skin dry and intact. 

## 2018-02-07 NOTE — Discharge Summary (Signed)
Colleen Olsen RPR:[id number] DOB: [birth year] DOA: 02/05/2018  PCP: Dema Severin, NP  Admit date: 02/05/2018  Discharge date: 02/07/2018  Admitted From: Home  Disposition:  Home   Recommendations for Outpatient Follow-up:   Follow up with PCP in 1-2 weeks  PCP Please obtain BMP/CBC, 2 view CXR in 1week,  (see Discharge instructions)   PCP Please follow up on the following pending results:    Home Health: None   Equipment/Devices: None  Consultations: PCCM Discharge Condition: Fair   CODE STATUS: Full   Diet Recommendation: Heart Healthy     Chief Complaint  Patient presents with  . 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  She has had 5 episodes of pneumonia in the last 1 year now.                                                                 Hospital Course      1.  Recurrent pneumonia.  Likely due to silent aspiration which happens due to patient being on very high doses of Seroquel at night in conjunction with high doses of narcotics which she takes 3 times a day with 1 dose being at night.  At this time she does not appear toxic, currently asymptomatic on room air has been transitioned down to oral Augmentin, which, also underwent esophageal Cheree Ditto which was unremarkable, she definitely has reoccurring aspiration at night  due to being on multiple sedating medications as below.  She has been strictly counseled to cut them down as this is going to progress definitely to pulmonary necrosis and lung abscess formation in the future and can be life-threatening as well.  Request PCP to please reassess her medications urgently.  2.  Low back pain.  Stable.  In no distress.  On high doses of narcotics at home, counseled to gradually taper off.  Request PCP to kindly make sure patient is not taking sedative medications on a regular basis.  3.  Factor V Leiden deficiency with history of PE.  Continue Xarelto  4.  Hypokalemia.  Replaced.   Request patient's outpatient primary care team to kindly cut down patient's sedative medications, she is on the following medications at home, Seroquel 450 p.o. nightly, Remeron 45 nightly, Neurontin 600 x 4 times a day, BuSpar 10 mg nightly + high dose Narcotic TID.  I am getting these medications down on discharge PCP to continue to taper them down.  This is life-threatening.   Discharge diagnosis     Principal Problem:   Lobar pneumonia (HCC) Active Problems:   Hypokalemia   Factor V Leiden [facility])    Discharge instructions    Discharge Instructions    Diet - low sodium heart healthy   Complete by:  As directed    Discharge instructions   Complete by:  As directed    Follow with Primary MD Dema Severin, NP in 7 days   Get CBC, CMP, 2 view Chest X ray -  checked  by Primary MD in 5-7 days   Activity: As tolerated with Full fall precautions use walker/cane & assistance as needed  Disposition Home    Diet: Heart Healthy   Special Instructions: If you have smoked or chewed Tobacco  in the last 2 yrs please stop smoking, stop any regular Alcohol  and or any Recreational drug use.  On your next visit with your primary care physician please Get Medicines reviewed and adjusted.  Please request your Prim.MD to go over all Hospital Tests and Procedure/Radiological  results at the follow up, please get all Hospital records sent to your Prim MD by signing hospital release before you go home.  If you experience worsening of your admission symptoms, develop shortness of breath, life threatening emergency, suicidal or homicidal thoughts you must seek medical attention immediately by calling 911 or calling your MD immediately  if symptoms less severe.  You Must read complete instructions/literature along with all the possible adverse reactions/side effects for all the Medicines you take and that have been prescribed to you. Take any new Medicines after you have completely understood and accpet all the possible adverse reactions/side effects.   Increase activity slowly   Complete by:  As directed       Discharge Medications   Allergies as of 02/07/2018      Reactions   Doxycycline    confusion   Keflex [cephalexin] Hives, Itching   Latex    Hives, burning   Paxil [paroxetine Hcl]    Hives, itching   Prozac [fluoxetine Hcl]    Hives, itching   Sulfa Antibiotics    Hives      Medication List    TAKE these medications   amoxicillin-clavulanate 875-125 MG tablet Commonly known as:  AUGMENTIN Take 1 tablet by mouth 2 (two) times daily for 10 days.   aspirin EC 81 MG tablet Take 81 mg by mouth daily.   busPIRone 10 MG tablet Commonly known as:  BUSPAR Take 10 mg by mouth 3 (three) times daily.   eszopiclone 3 MG Tabs Generic drug:  Eszopiclone Take 3 mg by mouth at bedtime. Take immediately before bedtime   gabapentin 600 MG tablet Commonly known as:  NEURONTIN Take 0.5 tablets (300 mg total) by mouth 3 (three) times daily. What changed:    how much to take  when to take this  reasons to take this   hydrochlorothiazide 25 MG tablet Commonly known as:  HYDRODIURIL Take 25 mg by mouth daily as needed (fluid).   mirtazapine 45 MG tablet Commonly known as:  REMERON Take 45 mg by mouth at bedtime.   multivitamin with minerals Tabs  tablet Take 1 tablet by mouth daily.   oxycodone 5 MG capsule Commonly known as:  OXY-IR Take 1 capsule (5 mg total) by mouth every 6 (six) hours as needed. What changed:  how much to take  when to take this   pantoprazole 40 MG tablet Commonly known as:  PROTONIX Take 1 tablet (40 mg total) by mouth daily.   polyethylene glycol packet Commonly known as:  MIRALAX / GLYCOLAX Take 17 g by mouth daily as needed for moderate constipation.   QUEtiapine 200 MG tablet Commonly known as:  SEROQUEL Take 1 tablet (200 mg total) by mouth at bedtime. What changed:    medication strength  how much to take  Another medication with the same name was removed. Continue taking this medication, and follow the directions you see here.   rivaroxaban 20 MG Tabs tablet Commonly known as:  XARELTO Take 20 mg by mouth daily with supper.   venlafaxine XR 150 MG 24 hr capsule Commonly known as:  EFFEXOR-XR Take 150 mg by mouth daily with breakfast.   venlafaxine XR 150 MG 24 hr capsule Commonly known as:  EFFEXOR-XR Take 150 mg by mouth daily with breakfast.       Follow-up Information    Dema Severin, NP. Schedule an appointment as soon as possible for a visit in 1 week(s).   Contact information: [address] [place] [address] [phone]        Kalman Shan, MD. Schedule an appointment as soon as possible for a visit in 1 week(s).   Specialty:  Pulmonary Disease Contact information: [address] [place] [address] [phone]           Major procedures and Radiology Reports - PLEASE review detailed and final reports thoroughly  -        Dg Chest 2 View  Result Date: 02/05/2018 CLINICAL DATA:  Recurrent pneumonia. EXAM: CHEST - 2 VIEW COMPARISON:  January 17, 2018 FINDINGS: Multifocal infiltrates are seen in the lungs, significantly worsened since January 17, 2018. The opacities are most prominent in the right lung, possibly involving the  right upper lobe on today's study. There also appears to be some mild patchy opacity in the left base which is new. No change in the cardiomediastinal silhouette. No pneumothorax. No other acute abnormalities. IMPRESSION: 1. Multifocal infiltrates, significantly worsened since January 17, 2018. The worsening despite treatment over 19 days raises the possibility of a poorly treated pneumonia, an atypical infection, and aspiration. Other etiologies are possible. Recommend clinical correlation and follow-up to resolution. If the patient's symptoms continue to worsen and/or persist, recommend pulmonary consultation for further assessment. Electronically Signed   By: Gerome Sam III M.D   On: 02/05/2018 17:29   Ct Angio Chest Pe W Or Wo Contrast  Result Date: 01/17/2018 CLINICAL DATA:  Evaluate for pulmonary embolism. Hemoptysis. Previous pulmonary embolus in 04/2017 EXAM: CT ANGIOGRAPHY CHEST WITH CONTRAST TECHNIQUE: Multidetector CT imaging of the chest was performed using the standard protocol during bolus administration of intravenous contrast. Multiplanar CT image reconstructions and MIPs were obtained to evaluate the vascular anatomy. CONTRAST:  ISOVUE-370 IOPAMIDOL (ISOVUE-370) INJECTION 76% COMPARISON:  03/07/2017 FINDINGS: Cardiovascular: Normal heart size. No pericardial effusion identified. The main pulmonary artery is patent. There is no saddle embolus. No central obstructing pulmonary emboli identified bilaterally. No lobar or segmental pulmonary artery filling defects identified. Mediastinum/Nodes: No enlarged mediastinal, hilar, or axillary lymph nodes. Thyroid gland, trachea, and esophagus demonstrate no significant findings. Lungs/Pleura: Large area of dense airspace consolidation within the posterior right lower lobe is identified. There are patchy areas of ground-glass attenuation scattered throughout the remaining portions of the lungs. Calcified granuloma identified  within the right upper  lobe. Upper Abdomen: Previous cholecystectomy.  No acute abnormality. Musculoskeletal: No chest wall abnormality. No acute or significant osseous findings. Review of the MIP images confirms the above findings. IMPRESSION: 1. No evidence for acute pulmonary embolus. 2. Large area of airspace consolidation within the right lower lobe is identified, new from 11/24/2017 and worrisome for pneumonia. Scattered areas of ground-glass attenuation throughout the remaining portions of the lungs are likely infectious/inflammatory in etiology. Electronically Signed   By: Signa Kellaylor  Stroud M.D.   On: 01/17/2018 16:46   Dg Chest Port 1 View  Result Date: 01/17/2018 CLINICAL DATA:  Acute onset of hemoptysis. Patient on Xarelto. EXAM: PORTABLE CHEST 1 VIEW COMPARISON:  Chest radiograph performed earlier today at 12:07 a.m. FINDINGS: The lungs are well-aerated. Persistent right basilar airspace opacity is suspicious for pneumonia. As before, aspiration cannot be excluded. There is no evidence of pleural effusion or pneumothorax. The cardiomediastinal silhouette is normal in size. Postoperative change is noted overlying the mediastinum. No acute osseous abnormalities are seen. IMPRESSION: Persistent right basilar airspace opacity is suspicious for pneumonia, stable from the recent prior study. As before, aspiration cannot be excluded. Electronically Signed   By: Roanna RaiderJeffery  Chang M.D.   On: 01/17/2018 03:59   Dg Esophagus W Single Cm (sol Or Thin Ba)  Result Date: 02/06/2018 CLINICAL DATA:  Five recent pneumonias.  Evaluate for reflux. EXAM: ESOPHOGRAM/BARIUM SWALLOW TECHNIQUE: Single contrast examination was performed using thin barium or water soluble. FLUOROSCOPY TIME:  Fluoroscopy Time:  2 minutes and 18 seconds Radiation Exposure Index (if provided by the fluoroscopic device): Number of Acquired Spot Images: 0 COMPARISON:  None. FINDINGS: The esophagus is normal in appearance with no stricture or mass. The patient is status post  gastric bypass with postsurgical changes identified. There is normal filling of the gastric remnant and normal emptying of the remnant into the bowel. Minimal reflux was seen into the distal esophagus, only with Valsalva. No other abnormalities. No aspiration during today's study. IMPRESSION: 1. Minimal reflux into the distal esophagus, only with Valsalva. 2. No other significant abnormalities. Electronically Signed   By: Gerome Samavid  Williams III M.D   On: 02/06/2018 13:28    Micro Results    No results found for this or any previous visit (from the past 240 hour(s)).  Today   Subjective    Sherryl Rollinson today has no headache,no chest abdominal pain,no new weakness tingling or numbness, feels much better wants to go home today.    Objective   Blood pressure 123/80, pulse 91, temperature 98.4 F (36.9 C), temperature source Oral, resp. rate 16, height 5\' 7"  (1.702 m), weight 93.9 kg, SpO2 94 %.   Intake/Output Summary (Last 24 hours) at 02/07/2018 0831 Last data filed at 02/07/2018 0143 Gross per 24 hour  Intake 2220 ml  Output -  Net 2220 ml    Exam Awake Alert, Oriented x 3, No new F.N deficits, Normal affect [place].AT,PERRAL Supple Neck,No JVD, No cervical lymphadenopathy appriciated.  Symmetrical Chest wall movement, Good air movement bilaterally, few RLL rales RRR,No Gallops,Rubs or new Murmurs, No Parasternal Heave +ve B.Sounds, Abd Soft, Non tender, No organomegaly appriciated, No rebound -guarding or rigidity. No Cyanosis, Clubbing or edema, No new Rash or bruise   Data Review   CBC w Diff:  Lab Results  Component Value Date   WBC 9.4 02/05/2018   HGB 10.6 (L) 02/05/2018   HCT 35.3 (L) 02/05/2018   PLT 352 02/05/2018   LYMPHOPCT 15 02/05/2018  MONOPCT 6 02/05/2018   EOSPCT 2 02/05/2018   BASOPCT 1 02/05/2018    CMP:  Lab Results  Component Value Date   NA 136 02/05/2018   K 3.1 (L) 02/07/2018   CL 99 02/05/2018   CO2 25 02/05/2018   BUN 13 02/05/2018    CREATININE 0.68 02/05/2018   PROT 6.0 (L) 02/05/2018   ALBUMIN 2.7 (L) 02/05/2018   BILITOT 0.4 02/05/2018   ALKPHOS 70 02/05/2018   AST 17 02/05/2018   ALT 13 02/05/2018  .   Total Time in preparing paper work, data evaluation and todays exam - 35 minutes  Susa Raring M.D on 02/07/2018 at 8:31 AM  [facility]   Office  [phone]

## 2018-02-07 NOTE — Plan of Care (Signed)
  Problem: Clinical Measurements: Goal: Ability to maintain clinical measurements within normal limits will improve Outcome: Progressing   Problem: Clinical Measurements: Goal: Diagnostic test results will improve Outcome: Progressing   Problem: Pain Managment: Goal: General experience of comfort will improve Outcome: Progressing   

## 2018-02-07 NOTE — Progress Notes (Signed)
SATURATION QUALIFICATIONS: (This note is used to comply with regulatory documentation for home oxygen)  Patient Saturations on Room Air at Rest = 100%  Patient Saturations on Room Air while Ambulating = 99%  No home oxygen needed.

## 2019-01-04 DIAGNOSIS — R4182 Altered mental status, unspecified: Secondary | ICD-10-CM | POA: Diagnosis not present

## 2019-01-04 DIAGNOSIS — E876 Hypokalemia: Secondary | ICD-10-CM | POA: Diagnosis not present

## 2019-01-04 DIAGNOSIS — I2699 Other pulmonary embolism without acute cor pulmonale: Secondary | ICD-10-CM

## 2019-01-04 DIAGNOSIS — J9600 Acute respiratory failure, unspecified whether with hypoxia or hypercapnia: Secondary | ICD-10-CM | POA: Diagnosis not present

## 2019-01-04 DIAGNOSIS — E870 Hyperosmolality and hypernatremia: Secondary | ICD-10-CM | POA: Diagnosis not present

## 2019-01-05 DIAGNOSIS — E870 Hyperosmolality and hypernatremia: Secondary | ICD-10-CM | POA: Diagnosis not present

## 2019-01-05 DIAGNOSIS — J9600 Acute respiratory failure, unspecified whether with hypoxia or hypercapnia: Secondary | ICD-10-CM | POA: Diagnosis not present

## 2019-01-05 DIAGNOSIS — E876 Hypokalemia: Secondary | ICD-10-CM | POA: Diagnosis not present

## 2019-01-05 DIAGNOSIS — R4182 Altered mental status, unspecified: Secondary | ICD-10-CM | POA: Diagnosis not present

## 2019-01-06 DIAGNOSIS — R4182 Altered mental status, unspecified: Secondary | ICD-10-CM | POA: Diagnosis not present

## 2019-01-06 DIAGNOSIS — E876 Hypokalemia: Secondary | ICD-10-CM | POA: Diagnosis not present

## 2019-01-06 DIAGNOSIS — E870 Hyperosmolality and hypernatremia: Secondary | ICD-10-CM | POA: Diagnosis not present

## 2019-01-06 DIAGNOSIS — J9600 Acute respiratory failure, unspecified whether with hypoxia or hypercapnia: Secondary | ICD-10-CM | POA: Diagnosis not present

## 2019-01-07 DIAGNOSIS — J9600 Acute respiratory failure, unspecified whether with hypoxia or hypercapnia: Secondary | ICD-10-CM | POA: Diagnosis not present

## 2019-01-07 DIAGNOSIS — R4182 Altered mental status, unspecified: Secondary | ICD-10-CM | POA: Diagnosis not present

## 2019-01-07 DIAGNOSIS — E876 Hypokalemia: Secondary | ICD-10-CM | POA: Diagnosis not present

## 2019-01-07 DIAGNOSIS — E870 Hyperosmolality and hypernatremia: Secondary | ICD-10-CM | POA: Diagnosis not present

## 2019-01-08 DIAGNOSIS — R4182 Altered mental status, unspecified: Secondary | ICD-10-CM | POA: Diagnosis not present

## 2019-01-08 DIAGNOSIS — J9600 Acute respiratory failure, unspecified whether with hypoxia or hypercapnia: Secondary | ICD-10-CM | POA: Diagnosis not present

## 2019-01-08 DIAGNOSIS — E876 Hypokalemia: Secondary | ICD-10-CM | POA: Diagnosis not present

## 2019-01-08 DIAGNOSIS — E870 Hyperosmolality and hypernatremia: Secondary | ICD-10-CM | POA: Diagnosis not present

## 2019-01-27 ENCOUNTER — Telehealth: Payer: Self-pay | Admitting: Critical Care Medicine

## 2019-01-27 NOTE — Telephone Encounter (Signed)
During covid screening, patient states she has pneumonia, she currently is taking cold medication with tylenol in it. She is actively coughing, shortness of breath, and weakness. Patient also is feeling nauseous.  Dr. Chestine Spore please advise if patient needs to reschedule consult , or change to a video

## 2019-01-27 NOTE — Telephone Encounter (Signed)
Sounds like she needs a Covid test. I am happy to do a televisit if she needs it, but regardless I think she should be evaluated to determine if she has covid.  Idalia Needle

## 2019-01-27 NOTE — Telephone Encounter (Signed)
Spoke with patient.  Let her know Dr. Chestine Spore wants patient to be covid tested prior to OV. Patient states she was tested in hospital was negative, had hospital encounter where she aspirated and now has pneumonia. Needs consult by next week before losing insurance.   Patient given outpatient scheduling information for covid testing Patient states she will get that scheduled in the next day or two .  Patient rescheduled with Dr. Katrinka Blazing next week pending results.

## 2019-01-28 ENCOUNTER — Institutional Professional Consult (permissible substitution): Payer: Medicaid Other | Admitting: Critical Care Medicine

## 2019-02-04 ENCOUNTER — Other Ambulatory Visit: Payer: Self-pay

## 2019-02-04 ENCOUNTER — Other Ambulatory Visit (HOSPITAL_COMMUNITY): Payer: Self-pay

## 2019-02-04 ENCOUNTER — Encounter: Payer: Self-pay | Admitting: Internal Medicine

## 2019-02-04 ENCOUNTER — Ambulatory Visit: Payer: Medicaid Other | Admitting: Internal Medicine

## 2019-02-04 VITALS — BP 110/70 | HR 87 | Temp 97.3°F | Ht 68.0 in | Wt 200.6 lb

## 2019-02-04 DIAGNOSIS — J849 Interstitial pulmonary disease, unspecified: Secondary | ICD-10-CM

## 2019-02-04 DIAGNOSIS — R1310 Dysphagia, unspecified: Secondary | ICD-10-CM

## 2019-02-04 LAB — CBC WITH DIFFERENTIAL/PLATELET
Basophils Absolute: 0.1 10*3/uL (ref 0.0–0.1)
Basophils Relative: 0.6 % (ref 0.0–3.0)
Eosinophils Absolute: 1.4 10*3/uL — ABNORMAL HIGH (ref 0.0–0.7)
Eosinophils Relative: 12.1 % — ABNORMAL HIGH (ref 0.0–5.0)
HCT: 35.8 % — ABNORMAL LOW (ref 36.0–46.0)
Hemoglobin: 11.8 g/dL — ABNORMAL LOW (ref 12.0–15.0)
Lymphocytes Relative: 14.7 % (ref 12.0–46.0)
Lymphs Abs: 1.6 10*3/uL (ref 0.7–4.0)
MCHC: 32.9 g/dL (ref 30.0–36.0)
MCV: 96.6 fl (ref 78.0–100.0)
Monocytes Absolute: 0.9 10*3/uL (ref 0.1–1.0)
Monocytes Relative: 7.7 % (ref 3.0–12.0)
Neutro Abs: 7.2 10*3/uL (ref 1.4–7.7)
Neutrophils Relative %: 64.9 % (ref 43.0–77.0)
Platelets: 309 10*3/uL (ref 150.0–400.0)
RBC: 3.7 Mil/uL — ABNORMAL LOW (ref 3.87–5.11)
RDW: 14.1 % (ref 11.5–15.5)
WBC: 11.1 10*3/uL — ABNORMAL HIGH (ref 4.0–10.5)

## 2019-02-04 LAB — SEDIMENTATION RATE: Sed Rate: 30 mm/hr — ABNORMAL HIGH (ref 0–20)

## 2019-02-04 LAB — C-REACTIVE PROTEIN: CRP: 17.1 mg/dL (ref 0.5–20.0)

## 2019-02-04 LAB — CK: Total CK: 40 U/L (ref 7–177)

## 2019-02-04 MED ORDER — PREDNISONE 20 MG PO TABS: ORAL_TABLET | ORAL | 0 refills | Status: DC

## 2019-02-04 NOTE — Progress Notes (Signed)
Synopsis: Recurrent pneumonia referral from [facility] NP Jan 2021  Subjective:   PATIENT ID: Colleen Olsen GENDER: female DOB: [birth year], MRN: [id number]  Chief Complaint  Patient presents with  . Consult    Patient is here post hospital admisson on 12/19, 1/3 and 1/22 for pneumonia.     HPI Here for recurrent pneumonias. 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, PFO post closure, pacemaker - Esophagram mild reflux - Mild postnasal drip - Uses albuterol MDI and nebs to mild effect  ROS Positive Symptoms in bold:  Constitutional fevers, chills, weight loss, fatigue, anorexia, malaise  Eyes decreased vision, double vision, eye irritation  Ears, Nose, Mouth, Throat sore throat, trouble swallowing, sinus congestion  Cardiovascular chest pain, paroxysmal nocturnal dyspnea, lower ext edema, palpitations   Respiratory SOB, cough, DOE, hemoptysis, wheezing  Gastrointestinal nausea, vomiting, diarrhea  Genitourinary burning with urination, trouble urinating  Musculoskeletal joint aches, joint swelling, back pain  Integumentary  rashes, skin lesions  Neurological focal weakness, focal numbness, trouble speaking, headaches  Psychiatric depression, anxiety, confusion  Endocrine polyuria, polydipsia, cold intolerance, heat intolerance  Hematologic abnormal bruising, abnormal bleeding, unexplained nose bleeds  Allergic/Immunologic  recurrent infections, hives, swollen lymph nodes    Past Medical History:  Diagnosis Date  . Anemia   . Arthritis   . Depression   . Factor II deficiency (HCC)   . Factor V Leiden (HCC)   . Headache(784.0)   . PFO (patent foramen ovale)    s/p closure  . Pulmonary embolism (HCC)   . Shortness of breath      Family History  Problem Relation Age of Onset  . Bradycardia Father        Pacemaker  . Hypertension Father   . Hypertension Mother   . Lung cancer Mother        Deceased     Past Surgical History:  Procedure Laterality Date  . ABDOMINAL HYSTERECTOMY    . ANKLE SURGERY    . CHOLECYSTECTOMY    . gastri bypass 2003    . PATENT FORAMEN OVALE(PFO) CLOSURE    . TONSILLECTOMY      Social History   Socioeconomic History  . Marital status: Married    Spouse name: Not on file  . Number of children: Not on file  . Years of education: Not on file  . Highest education level: Not on file  Occupational History  . Not on file  Tobacco Use  . Smoking status: Never Smoker  . Smokeless tobacco: Never Used  Substance and Sexual Activity  . Alcohol use: Yes    Comment: Rare  . Drug use: No  . Sexual activity: Not on file  Other Topics Concern  . Not on file  Social History Narrative  . Not on file   Social Determinants of Health   Financial Resource Strain:   . Difficulty of Paying Living Expenses: Not on file  Food  Insecurity:   . Worried About Programme researcher, broadcasting/film/video in the Last Year: Not on file  . Ran Out of Food in the Last Year: Not on file  Transportation Needs:   . Lack of Transportation (Medical): Not on file  . Lack of Transportation (Non-Medical): Not on file  Physical Activity:   . Days of Exercise per Week: Not on file  . Minutes of Exercise per Session: Not on file  Stress:   . Feeling of Stress : Not on file  Social Connections:   . Frequency of Communication with Friends and Family: Not on file  . Frequency of Social Gatherings with Friends and  Family: Not on file  . Attends Religious Services: Not on file  . Active Member of Clubs or Organizations: Not on file  . Attends Banker Meetings: Not on file  . Marital Status: Not on file  Intimate Partner Violence:   . Fear of Current or Ex-Partner: Not on file  . Emotionally Abused: Not on file  . Physically Abused: Not on file  . Sexually Abused: Not on file     Allergies  Allergen Reactions  . Ciprofloxacin Rash  . Doxycycline     confusion  . Keflex [Cephalexin] Hives and Itching  . Latex     Hives, burning  . Paxil [Paroxetine Hcl]     Hives, itching  . Prozac [Fluoxetine Hcl]     Hives, itching  . Sulfa Antibiotics     Hives      Outpatient Medications Prior to Visit  Medication Sig Dispense Refill  . amitriptyline (ELAVIL) 50 MG tablet Take 50 mg by mouth at bedtime.    Marland Kitchen aspirin EC 81 MG tablet Take 81 mg by mouth daily.    . busPIRone (BUSPAR) 10 MG tablet Take 10 mg by mouth 3 (three) times daily.     . hydrOXYzine (VISTARIL) 25 MG capsule Take 25 mg by mouth as needed.    . levETIRAcetam (KEPPRA) 500 MG tablet Take 500 mg by mouth 2 (two) times daily.    . Multiple Vitamin (MULTIVITAMIN WITH MINERALS) TABS tablet Take 1 tablet by mouth daily.    Marland Kitchen oxycodone (OXY-IR) 5 MG capsule Take 1 capsule (5 mg total) by mouth every 6 (six) hours as needed. 30 capsule 0  . pantoprazole (PROTONIX) 40 MG tablet Take 1 tablet (40 mg total) by mouth daily. 30 tablet 0  . polyethylene glycol (MIRALAX / GLYCOLAX) packet Take 17 g by mouth daily as needed for moderate constipation.    . QUEtiapine (SEROQUEL) 200 MG tablet Take 1 tablet (200 mg total) by mouth at bedtime. 30 tablet 0  . rivaroxaban (XARELTO) 20 MG TABS tablet Take 20 mg by mouth daily with supper.    . venlafaxine XR (EFFEXOR-XR) 150 MG 24 hr capsule Take 150 mg by mouth daily with breakfast.    . venlafaxine XR (EFFEXOR-XR) 150 MG 24 hr capsule Take 150 mg by mouth daily with breakfast.    .  Eszopiclone (ESZOPICLONE) 3 MG TABS Take 3 mg by mouth at bedtime. Take immediately before bedtime    . gabapentin (NEURONTIN) 600 MG tablet Take 0.5 tablets (300 mg total) by mouth 3 (three) times daily. 90 tablet 0  . hydrochlorothiazide (HYDRODIURIL) 25 MG tablet Take 25 mg by mouth daily as needed (fluid).     . mirtazapine (REMERON) 45 MG tablet Take 45 mg by mouth at bedtime.     No facility-administered medications prior to  visit.     Objective:  GEN: pleasant woman in NAD HEENT: she has saddle nose deformity, MM dry with cobblestoning CV: RRR, ext warm PULM: Scattered rhonci, no accessory muscle use, coughs with any deep inspiration GI: Soft, +BS EXT: No edema NEURO: Moves all 4 ext to command PSYCH: RASS 0, good insight SKIN: No rashes, slightly pale    Vitals:   02/04/19 0945  BP: 110/70  Pulse: 87  Temp: (!) 97.3 F (36.3 C)  TempSrc: Temporal  SpO2: 95%  Weight: 200 lb 9.6 oz (91 kg)  Height: 5\' 8"  (1.727 m)   95% on RA BMI Readings from Last 3 Encounters:  02/04/19 30.50 kg/m  02/05/18 32.42 kg/m  01/20/18 32.53 kg/m   Wt Readings from Last 3 Encounters:  02/04/19 200 lb 9.6 oz (91 kg)  02/05/18 207 lb (93.9 kg)  01/20/18 207 lb 10.8 oz (94.2 kg)     CBC    Component Value Date/Time   WBC 9.4 02/05/2018 1631   RBC 3.95 02/05/2018 1631   HGB 10.6 (L) 02/05/2018 1631   HCT 35.3 (L) 02/05/2018 1631   PLT 352 02/05/2018 1631   MCV 89.4 02/05/2018 1631   MCH 26.8 02/05/2018 1631   MCHC 30.0 02/05/2018 1631   RDW 15.9 (H) 02/05/2018 1631   LYMPHSABS 1.4 02/05/2018 1631   MONOABS 0.5 02/05/2018 1631   EOSABS 0.2 02/05/2018 1631   BASOSABS 0.1 02/05/2018 1631   01/17/18 IMPRESSION: 1. No evidence for acute pulmonary embolus. 2. Large area of airspace consolidation within the right lower lobe is identified, new from 11/24/2017 and worrisome for pneumonia. Scattered areas of ground-glass attenuation throughout the remaining portions of the  lungs are likely infectious/inflammatory in Etiology.  02/06/19 CXR Multifocal pneumonia    Assessment & Plan:   # Recurrent pneumonia, latest with DAD pattern: differential is COP/BOOP, CTD-ILD or recurrent aspiration syndrome, does have dysphagia symptoms and saddle nose deformity.  Seems to respond well to steroids.  No obvious inciting environmental or medication insult.  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   Will probably refer to one of our ILD guys down the line.   Current Outpatient Medications:  .  amitriptyline (ELAVIL) 50 MG tablet, Take 50 mg by mouth at bedtime., Disp: , Rfl:  .  aspirin EC 81 MG tablet, Take 81 mg by mouth daily., Disp: , Rfl:  .  busPIRone (BUSPAR) 10 MG tablet, Take 10 mg by mouth 3 (three) times daily. , Disp: , Rfl:  .  hydrOXYzine (VISTARIL) 25 MG capsule, Take 25 mg by mouth as needed., Disp: , Rfl:  .  levETIRAcetam (KEPPRA) 500 MG tablet, Take 500 mg by mouth 2 (two) times daily., Disp: , Rfl:  .  Multiple Vitamin (MULTIVITAMIN WITH MINERALS) TABS tablet, Take 1 tablet by mouth daily., Disp: , Rfl:  .  oxycodone (OXY-IR) 5 MG capsule, Take 1 capsule (5 mg total) by mouth every 6 (six) hours as needed.,  Disp: 30 capsule, Rfl: 0 .  pantoprazole (PROTONIX) 40 MG tablet, Take 1 tablet (40 mg total) by mouth daily., Disp: 30 tablet, Rfl: 0 .  polyethylene glycol (MIRALAX / GLYCOLAX) packet, Take 17 g by mouth daily as needed for moderate constipation., Disp: , Rfl:  .  QUEtiapine (SEROQUEL) 200 MG  tablet, Take 1 tablet (200 mg total) by mouth at bedtime., Disp: 30 tablet, Rfl: 0 .  rivaroxaban (XARELTO) 20 MG TABS tablet, Take 20 mg by mouth daily with supper., Disp: , Rfl:  .  venlafaxine XR (EFFEXOR-XR) 150 MG 24 hr capsule, Take 150 mg by mouth daily with breakfast., Disp: , Rfl:  .  venlafaxine XR (EFFEXOR-XR) 150 MG 24 hr capsule, Take 150 mg by mouth daily with breakfast., Disp: , Rfl:  .  predniSONE (DELTASONE) 20 MG tablet, Take 1 tablet (20 mg total) by mouth daily with breakfast for 30 days, THEN 0.5 tablets (10 mg total) daily with breakfast., Disp: 45 tablet, Rfl: 0   [name], MD [facility] 02/04/2019 10:32 AM

## 2019-02-04 NOTE — Patient Instructions (Signed)
It was nice to meet you  - Labs - Steroids as we discussed - Lung function tests at some point (non-urgent) - Modified barium swallow study - 1 month tele-visit, call sooner if feeling unwell

## 2019-02-05 LAB — IGE: IgE (Immunoglobulin E), Serum: 6 kU/L (ref <=114)

## 2019-02-09 ENCOUNTER — Ambulatory Visit (HOSPITAL_COMMUNITY)
Admission: RE | Admit: 2019-02-09 | Discharge: 2019-02-09 | Disposition: A | Discharge status: Home / Self Care | Payer: Medicaid Other | Source: Ambulatory Visit | Attending: Internal Medicine | Admitting: Internal Medicine

## 2019-02-09 ENCOUNTER — Other Ambulatory Visit: Payer: Self-pay

## 2019-02-09 DIAGNOSIS — R1310 Dysphagia, unspecified: Secondary | ICD-10-CM | POA: Diagnosis present

## 2019-02-09 DIAGNOSIS — J849 Interstitial pulmonary disease, unspecified: Secondary | ICD-10-CM

## 2019-02-09 NOTE — Evaluation (Signed)
Modified Barium Swallow Progress Note  Patient Details  Name: Colleen Olsen MRN: [id number] Date of Birth: [birth year]  Today's Date: 02/09/2019  Modified Barium Swallow completed.  Full report located under Chart Review in the Imaging Section.  Brief recommendations include the following:  Clinical Impression Patient presents with oropharyngeal functioning that is [facility]. No penetration/aspiration noted for all consistencies. Pt currently has complains of having difficulties with solid consistencies, stating "they get stuck in her throat." After consuming x3 bites of solid consistencies, pt stated she felt like she had something in her throat; howvever, her pharynx was clear. During an espohageal sweep, it appeared that pt had barium remaining in her esophagus. However, blous cleared with x2 liquid wash. Due to this, she is most at risk for post-prandial aspiration. Pt should continue on the soft solid diet with small bites and sips, and utilize liquid wash as needed. Handout with esophageal precautions was provided.   Swallow Evaluation Recommendations   Recommended Consults: Consider GI evaluation;Consider esophageal assessment   SLP Diet Recommendations: Thin liquid;Dysphagia 3 (Mech soft) solids   Liquid Administration via: Straw;Cup   Medication Administration: Crushed with puree   Supervision: Patient able to self feed   Compensations: Small sips/bites;Slow rate;Follow solids with liquid   Postural Changes: Remain semi-upright after after feeds/meals (Comment);Seated upright at 90 degrees   Oral Care Recommendations: Patient independent with oral care       Maudry Mayhew, Student SLP Office: [phone]  02/09/2019,1:46 PM

## 2019-02-13 LAB — ANA SCREEN,IFA,REFLEX TITER/PATTERN,REFLEX MPLX 11 AB CASCADE
14-3-3 eta Protein: <0.2 ng/mL (ref <0.2)
Anti Nuclear Antibody (ANA): NEGATIVE
Cyclic Citrullin Peptide Ab: <16 UNITS
Rheumatoid fact SerPl-aCnc: <14 IU/mL (ref <14)

## 2019-02-13 LAB — ANTI-SCLERODERMA ANTIBODY: Scleroderma (Scl-70) (ENA) Antibody, IgG: <1 AI

## 2019-02-13 LAB — MPO/PR-3 (ANCA) ANTIBODIES
Myeloperoxidase Abs: <1 AI
Serine Protease 3: <1 AI

## 2019-02-13 LAB — ANCA SCREEN W REFLEX TITER: ANCA Screen: NEGATIVE

## 2019-02-28 NOTE — Progress Notes (Signed)
Visit done over phone with patient consent due to [place] pandemic.  Synopsis: Recurrent pneumonia referral from [facility] NP Jan 2021  Assessment & Plan:    Recurrent pneumonia, latest with DAD pattern: with negative rheum panel, differential is COP/BOOP, chronic eosinophilic pneumonia, or recurrent aspiration syndrome.  Seems to respond well to steroids.  No obvious inciting environmental or medication insult.  I am leaning toward recurrent aspiration at this time. 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  Would consider something like breo if recurrence before continuing a low dose prednisone.  Will have her get a CXR near her home and another tele visit in 1 month; if continues to do well we can stop prednisone and space out visits.   MDM . I reviewed prior external note(s) from Mickel Baas with SLP on 02/09/19 . I reviewed the result(s) of CBC, ANA, ANCA, RF, CCP, Scl-70; multiple prior neg covid swabs . I have ordered GI consult, continued steroids, and CXR in 1 mo  Independent interpretation of tests . Review of patient's CXR image 1/22 reveal multifocal infiltrates. The patient's images have been independently reviewed by me.     [name], MD [facility] 02/28/2019 6:15 PM    Subjective:   PATIENT ID: Colleen Olsen GENDER: female DOB: 07/17/1970, MRN: [id number]  CC: cough  HPI Here for recurrent pneumonias, history as below. Last visit ordered rheum workup (notable for eosinophil count of 1400 otherwise normal), aspiration workup (esophagram showing no significant reflux but MBSS  showing retention only clearing with liquids) and started 1 mo of prednisone 20mg /day.  Doing very well with steroid taper, cough is almost completely resolved.  Has about 1 mo left with 10mg /day.  Here for recurrent pneumonias. 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   on RA BMI Readings from Last 3 Encounters:  02/04/19 30.50 kg/m  02/05/18 32.42 kg/m  01/20/18 32.53 kg/m   Wt Readings from Last 3 Encounters:  02/04/19 200 lb 9.6 oz (91 kg)  02/05/18 207 lb (93.9 kg)  01/20/18 207 lb 10.8 oz (94.2 kg)    Ancillary Information    Past Medical History:  Diagnosis Date  .  Anemia   . Arthritis   . Depression   . Factor II deficiency (HCC)   . Factor V Leiden (HCC)   . Headache(784.0)   . PFO (patent foramen ovale)    s/p closure  . Pulmonary embolism (HCC)   . Shortness of breath      Family History  Problem Relation Age of Onset  . Bradycardia Father        Pacemaker  . Hypertension Father   . Hypertension Mother   . Lung cancer  Mother        Deceased     Past Surgical History:  Procedure Laterality Date  . ABDOMINAL HYSTERECTOMY    . ANKLE SURGERY    . CHOLECYSTECTOMY    . gastri bypass 2003    . PATENT FORAMEN OVALE(PFO) CLOSURE    . TONSILLECTOMY      Social History   Socioeconomic History  . Marital status: Married    Spouse name: Not on file  . Number of children: Not on file  . Years of education: Not on file  . Highest education level: Not on file  Occupational History  . Not on file  Tobacco Use  . Smoking status: Never Smoker  . Smokeless tobacco: Never Used  Substance and Sexual Activity  . Alcohol use: Yes    Comment: Rare  . Drug use: No  . Sexual activity: Not on file  Other Topics Concern  . Not on file  Social History Narrative  . Not on file   Social Determinants of Health   Financial Resource Strain:   . Difficulty of Paying Living Expenses: Not on file  Food Insecurity:   . Worried About Programme researcher, broadcasting/film/video in the Last Year: Not on file  . Ran Out of Food in the Last Year: Not on file  Transportation Needs:   . Lack of Transportation (Medical): Not on file  . Lack of Transportation (Non-Medical): Not on file  Physical Activity:   . Days of Exercise per Week: Not on file  . Minutes of Exercise per Session: Not on file  Stress:   . Feeling of Stress : Not on file  Social Connections:   . Frequency of Communication with Friends and Family: Not on file  . Frequency of Social Gatherings with Friends and Family: Not on file  . Attends Religious Services: Not on file  . Active Member of Clubs or Organizations: Not on file  . Attends Banker Meetings: Not on file  . Marital Status: Not on file  Intimate Partner Violence:   . Fear of Current or Ex-Partner: Not on file  . Emotionally Abused: Not on file  . Physically Abused: Not on file  . Sexually Abused: Not on file     Allergies  Allergen Reactions  . Ciprofloxacin Rash  . Doxycycline     confusion   . Keflex [Cephalexin] Hives and Itching  . Latex     Hives, burning  . Paxil [Paroxetine Hcl]     Hives, itching  . Prozac [Fluoxetine Hcl]     Hives, itching  . Sulfa Antibiotics     Hives      Outpatient Medications Prior to Visit  Medication Sig Dispense Refill  . amitriptyline (ELAVIL) 50 MG tablet Take 50 mg by mouth at bedtime.    Marland Kitchen aspirin EC 81 MG tablet Take 81 mg by mouth daily.    . busPIRone (BUSPAR) 10 MG tablet Take 10 mg by mouth  3 (three) times daily.     . hydrOXYzine (VISTARIL) 25 MG capsule Take 25 mg by mouth as needed.    . levETIRAcetam (KEPPRA) 500 MG tablet Take 500 mg by mouth 2 (two) times daily.    . Multiple Vitamin (MULTIVITAMIN WITH MINERALS) TABS tablet Take 1 tablet by mouth daily.    Marland Kitchen oxycodone (OXY-IR) 5 MG capsule Take 1 capsule (5 mg total) by mouth every 6 (six) hours as needed. 30 capsule 0  . pantoprazole (PROTONIX) 40 MG tablet Take 1 tablet (40 mg total) by mouth daily. 30 tablet 0  . polyethylene glycol (MIRALAX / GLYCOLAX) packet Take 17 g by mouth daily as needed for moderate constipation.    . predniSONE (DELTASONE) 20 MG tablet Take 1 tablet (20 mg total) by mouth daily with breakfast for 30 days, THEN 0.5 tablets (10 mg total) daily with breakfast. 45 tablet 0  . QUEtiapine (SEROQUEL) 200 MG tablet Take 1 tablet (200 mg total) by mouth at bedtime. 30 tablet 0  . rivaroxaban (XARELTO) 20 MG TABS tablet Take 20 mg by mouth daily with supper.    . venlafaxine XR (EFFEXOR-XR) 150 MG 24 hr capsule Take 150 mg by mouth daily with breakfast.    . venlafaxine XR (EFFEXOR-XR) 150 MG 24 hr capsule Take 150 mg by mouth daily with breakfast.     No facility-administered medications prior to visit.    CBC    Component Value Date/Time   WBC 11.1 (H) 02/04/2019 1033   RBC 3.70 (L) 02/04/2019 1033   HGB 11.8 (L) 02/04/2019 1033   HCT 35.8 (L) 02/04/2019 1033   PLT 309.0 02/04/2019 1033   MCV 96.6 02/04/2019 1033   MCH 26.8 02/05/2018 1631    MCHC 32.9 02/04/2019 1033   RDW 14.1 02/04/2019 1033   LYMPHSABS 1.6 02/04/2019 1033   MONOABS 0.9 02/04/2019 1033   EOSABS 1.4 (H) 02/04/2019 1033   BASOSABS 0.1 02/04/2019 1033   01/17/18 IMPRESSION: 1. No evidence for acute pulmonary embolus. 2. Large area of airspace consolidation within the right lower lobe is identified, new from 11/24/2017 and worrisome for pneumonia. Scattered areas of ground-glass attenuation throughout the remaining portions of the lungs are likely infectious/inflammatory in Etiology.  02/06/19 CXR Multifocal pneumonia

## 2019-03-03 ENCOUNTER — Ambulatory Visit (INDEPENDENT_AMBULATORY_CARE_PROVIDER_SITE_OTHER): Payer: Medicaid Other | Admitting: Internal Medicine

## 2019-03-03 ENCOUNTER — Other Ambulatory Visit: Payer: Self-pay

## 2019-03-03 DIAGNOSIS — J189 Pneumonia, unspecified organism: Secondary | ICD-10-CM

## 2019-03-03 DIAGNOSIS — R933 Abnormal findings on diagnostic imaging of other parts of digestive tract: Secondary | ICD-10-CM

## 2019-03-03 NOTE — Patient Instructions (Signed)
CXR close to home and then tele visit after Continue steroids Call if any issues

## 2019-03-09 ENCOUNTER — Other Ambulatory Visit: Payer: Self-pay | Admitting: Internal Medicine

## 2019-03-09 ENCOUNTER — Encounter: Payer: Self-pay | Admitting: Gastroenterology

## 2019-03-24 ENCOUNTER — Ambulatory Visit: Payer: Medicaid Other | Admitting: Gastroenterology

## 2019-03-26 ENCOUNTER — Ambulatory Visit: Payer: Medicaid Other | Admitting: Pulmonary Disease

## 2019-04-28 ENCOUNTER — Telehealth: Payer: Self-pay | Admitting: Internal Medicine

## 2019-04-28 NOTE — Telephone Encounter (Signed)
Called Marcelino Duster back but received a message that the office was now closed. I found the faxed medical release from January in Dr. Michaelle Copas inbox. It looks like he was requesting a hospital discharge summary as well as any labs or xrays that were performed. Will leave message in triage for follow up. Will place the medical request fax on the bookshelf in triage.

## 2019-05-01 NOTE — Telephone Encounter (Signed)
Spoke with Marcelino Duster, she faxed the records to [place] in 4/6 and they are scanned into the chart. I will close encounter.

## 2019-07-16 DEATH — deceased

## 2020-02-01 ENCOUNTER — Other Ambulatory Visit: Payer: Self-pay | Admitting: Internal Medicine

## 2020-02-01 DIAGNOSIS — J849 Interstitial pulmonary disease, unspecified: Secondary | ICD-10-CM

## 2020-11-29 IMAGING — RF DG ESOPHAGUS
11 of 15 series · 15 of 24 positions shown · non-contrast
Comparison: None.

CLINICAL DATA: Five recent pneumonias.  Evaluate for reflux.

EXAM:
ESOPHOGRAM/BARIUM SWALLOW
TECHNIQUE: Single contrast examination was performed using thin barium or water
soluble.
FLUOROSCOPY TIME:  Fluoroscopy Time:  2 minutes and 18 seconds
Radiation Exposure Index (if provided by the fluoroscopic device):
Number of Acquired Spot Images: 0

[Series 1: cp_standard · 0.51mm/px · 2 of 15 frames shown (1 of 11)]
[frame 3/15]
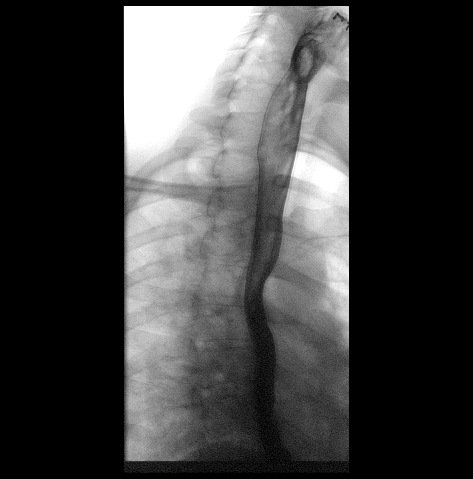
[frame 10/15]
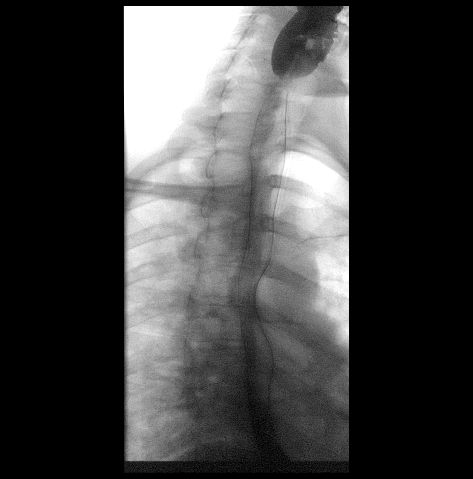

[Series 2: cp_standard · 0.51mm/px · 2 of 28 frames shown (2 of 11)]
[frame 7/28]
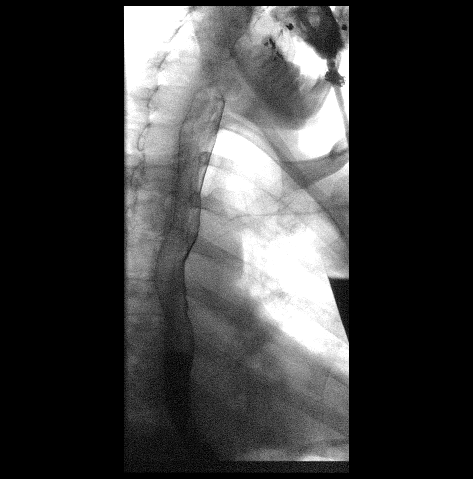
[frame 15/28]
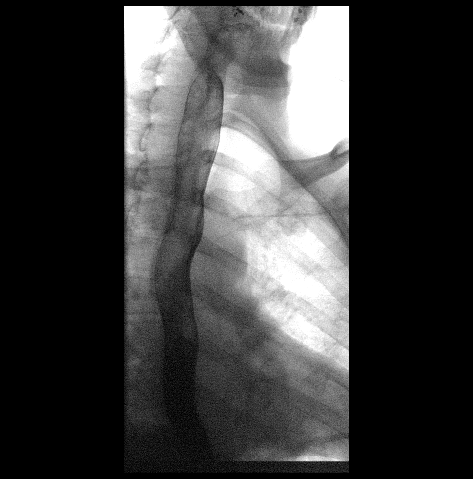

[Series 3: cp_standard · 0.51mm/px · 3 of 42 frames shown (3 of 11)]
[frame 5/42]
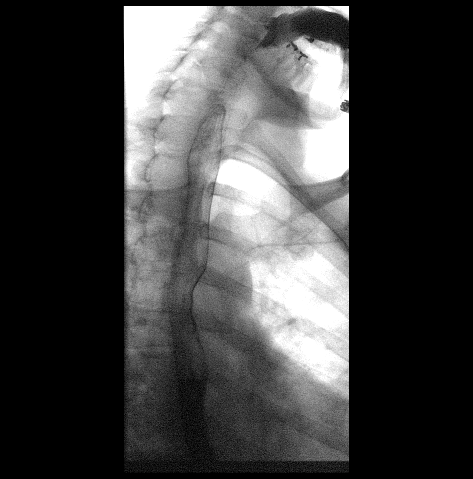
[frame 7/42]
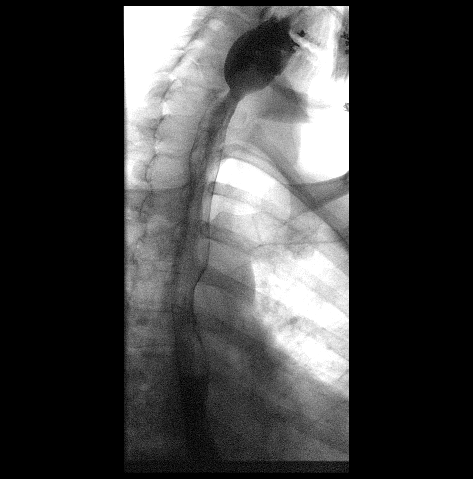
[frame 36/42]
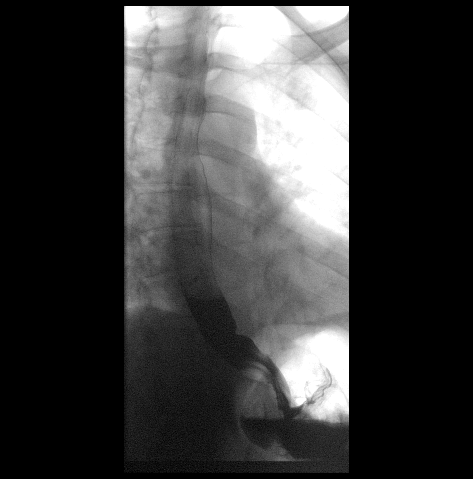

[Series 4: cp_standard · 0.51mm/px · 1 of 39 frames shown (4 of 11)]
[frame 34/39]
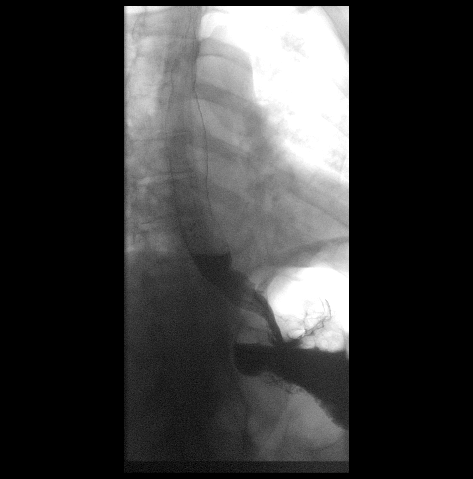

[Series 5: cp_standard · 0.26mm/px · 1 of 1 slices shown (5 of 11)]
[im 1/1]
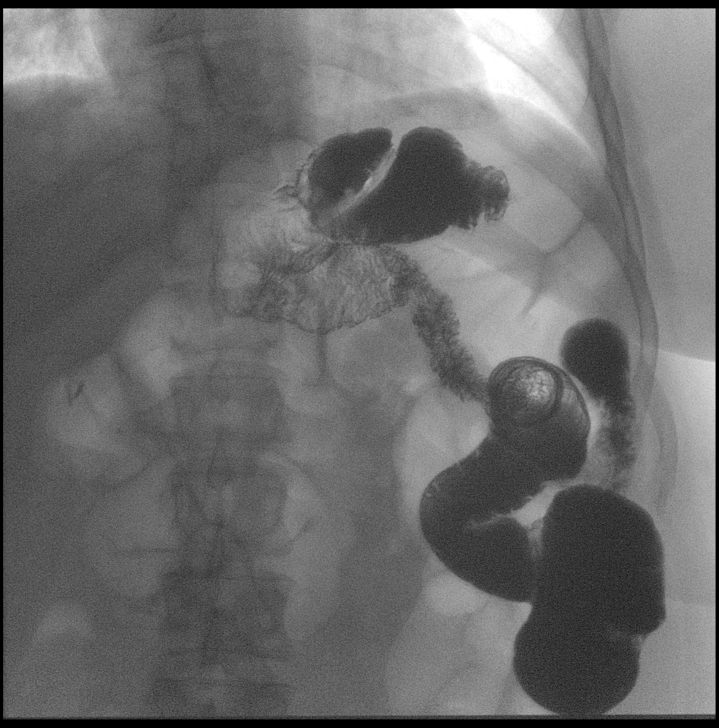

[Series 7: cp_standard · 0.25mm/px · 1 of 1 slices shown (6 of 11)]
[im 1/1]
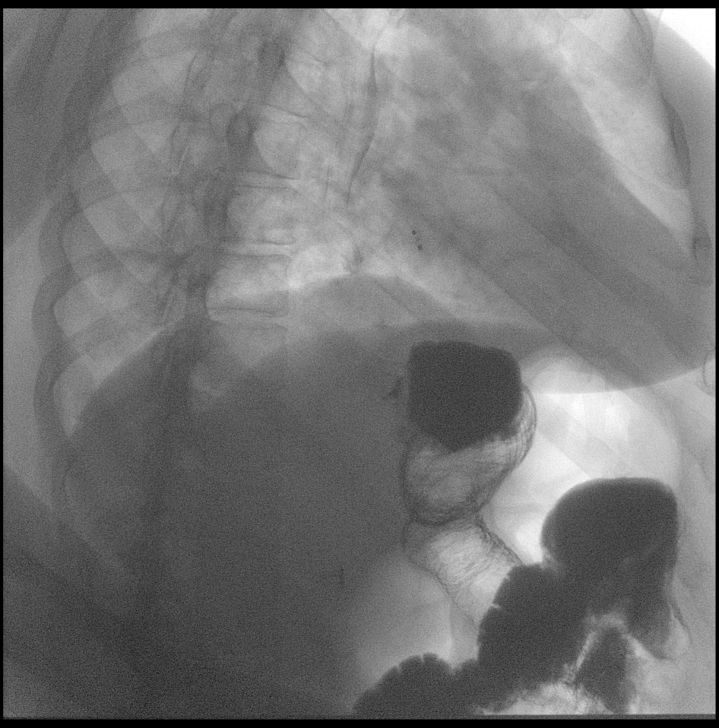

[Series 8: cp_standard · 0.25mm/px · 1 of 1 slices shown (7 of 11)]
[im 1/1]
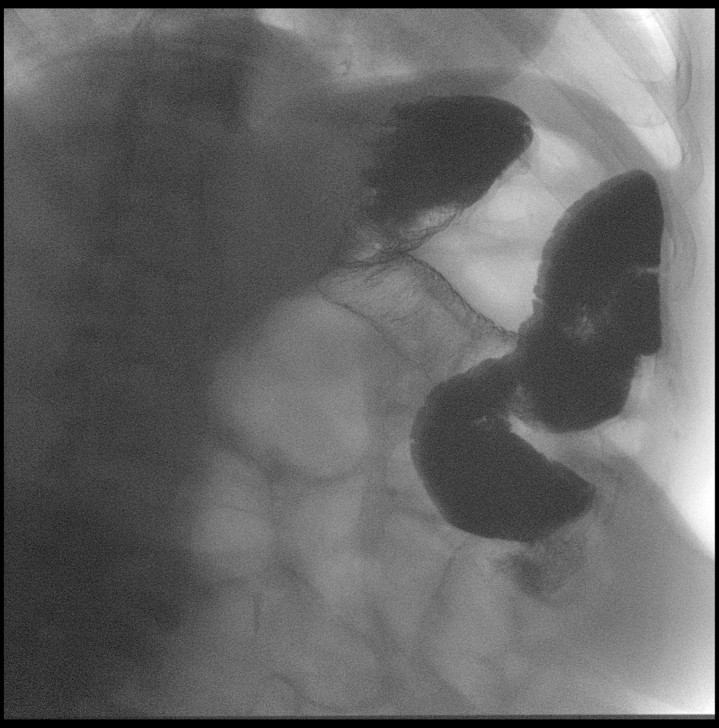

[Series 10: cp_standard · 0.25mm/px · 1 of 1 slices shown (8 of 11)]
[im 1/1]
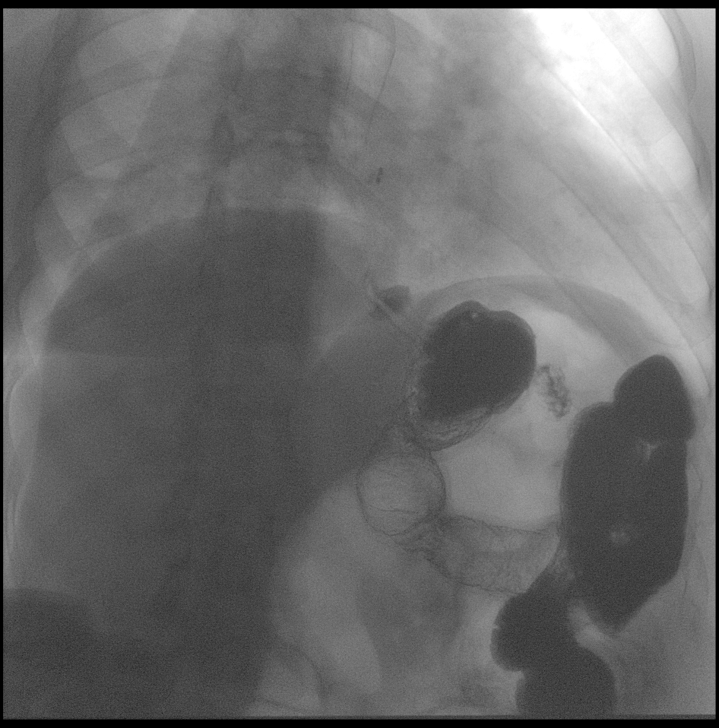

[Series 13: cp_standard · 0.25mm/px · 1 of 1 slices shown (9 of 11)]
[im 1/1]
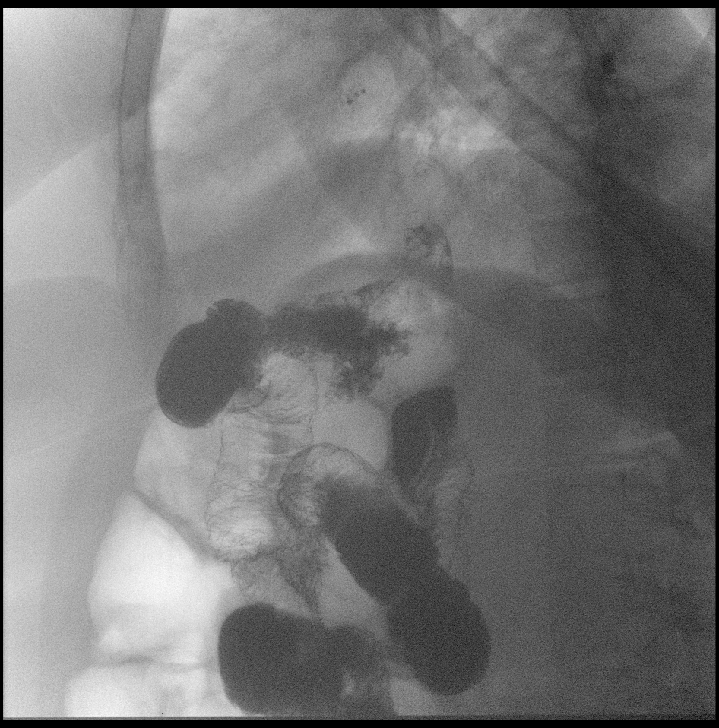

[Series 14: cp_standard · 0.25mm/px · 1 of 1 slices shown (10 of 11)]
[im 1/1]
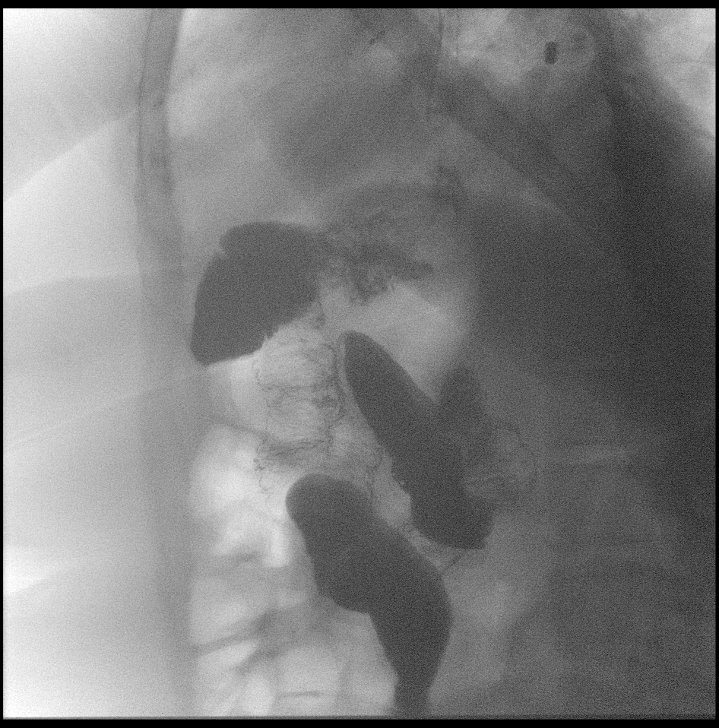

[Series 16: cp_standard · 0.25mm/px · 1 of 1 slices shown (11 of 11)]
[im 1/1]
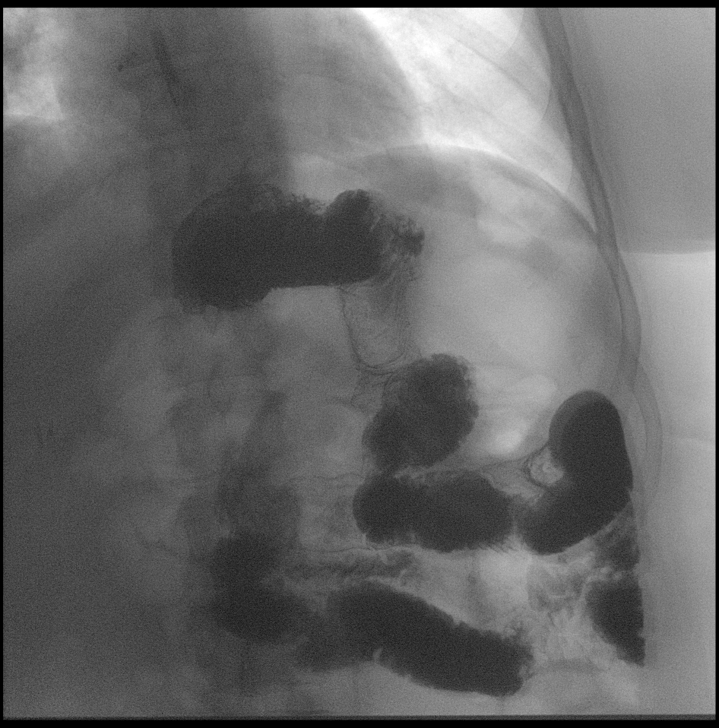

[15 of 24 positions shown; findings below may reference images not displayed]

FINDINGS: The esophagus is normal in appearance with no stricture or mass. The
patient is status post gastric bypass with postsurgical changes
identified. There is normal filling of the gastric remnant and
normal emptying of the remnant into the bowel. Minimal reflux was
seen into the distal esophagus, only with Valsalva. No other
abnormalities. No aspiration during today's study.
IMPRESSION: 1. Minimal reflux into the distal esophagus, only with Valsalva.
2. No other significant abnormalities.
